# Patient Record
Sex: Male | Born: 1960 | Race: White | Hispanic: Refuse to answer | Marital: Married | Smoking: Never smoker
Health system: Southern US, Community
[De-identification: ages and names within clinical notes are randomized; demographics above are authoritative.]

## PROBLEM LIST (undated history)

## (undated) ENCOUNTER — Emergency Department: Payer: BLUE CROSS/BLUE SHIELD

## (undated) DIAGNOSIS — T7840XA Allergy, unspecified, initial encounter: Secondary | ICD-10-CM

## (undated) DIAGNOSIS — I10 Essential (primary) hypertension: Secondary | ICD-10-CM

## (undated) DIAGNOSIS — A419 Sepsis, unspecified organism: Secondary | ICD-10-CM

## (undated) HISTORY — PX: APPENDECTOMY: SHX54

## (undated) HISTORY — DX: Sepsis, unspecified organism: A41.9

## (undated) HISTORY — DX: Essential (primary) hypertension: I10

## (undated) HISTORY — DX: Allergy, unspecified, initial encounter: T78.40XA

---

## 1997-11-18 ENCOUNTER — Encounter: Payer: Self-pay | Admitting: Emergency Medicine

## 1997-11-18 ENCOUNTER — Emergency Department (HOSPITAL_COMMUNITY): Admission: EM | Admit: 1997-11-18 | Discharge: 1997-11-18 | Payer: Self-pay | Admitting: Emergency Medicine

## 2000-02-12 ENCOUNTER — Encounter: Payer: Self-pay | Admitting: *Deleted

## 2000-02-12 ENCOUNTER — Inpatient Hospital Stay (HOSPITAL_COMMUNITY): Admission: EM | Admit: 2000-02-12 | Discharge: 2000-02-14 | Payer: Self-pay | Admitting: *Deleted

## 2000-02-14 ENCOUNTER — Encounter: Payer: Self-pay | Admitting: Cardiology

## 2004-10-29 ENCOUNTER — Other Ambulatory Visit: Payer: Self-pay

## 2004-10-29 ENCOUNTER — Emergency Department: Payer: Self-pay | Admitting: Emergency Medicine

## 2008-02-23 LAB — HM COLONOSCOPY

## 2014-02-03 ENCOUNTER — Emergency Department (HOSPITAL_COMMUNITY): Payer: BC Managed Care – PPO

## 2014-02-03 ENCOUNTER — Emergency Department (HOSPITAL_COMMUNITY)
Admission: EM | Admit: 2014-02-03 | Discharge: 2014-02-03 | Disposition: A | Discharge status: Home / Self Care | Payer: BC Managed Care – PPO | Attending: Emergency Medicine | Admitting: Emergency Medicine

## 2014-02-03 ENCOUNTER — Encounter (HOSPITAL_COMMUNITY): Payer: Self-pay | Admitting: Emergency Medicine

## 2014-02-03 DIAGNOSIS — R260 Ataxic gait: Secondary | ICD-10-CM | POA: Diagnosis not present

## 2014-02-03 DIAGNOSIS — R42 Dizziness and giddiness: Secondary | ICD-10-CM | POA: Diagnosis present

## 2014-02-03 LAB — COMPREHENSIVE METABOLIC PANEL
ALT: 17 U/L (ref 0–53)
AST: 25 U/L (ref 0–37)
Albumin: 3.9 g/dL (ref 3.5–5.2)
Alkaline Phosphatase: 55 U/L (ref 39–117)
Anion gap: 11 (ref 5–15)
BUN: 17 mg/dL (ref 6–23)
CO2: 28 mEq/L (ref 19–32)
Calcium: 9.5 mg/dL (ref 8.4–10.5)
Chloride: 102 mEq/L (ref 96–112)
Creatinine, Ser: 1.1 mg/dL (ref 0.50–1.35)
GFR calc Af Amer: 86 mL/min — ABNORMAL LOW (ref >90)
GFR calc non Af Amer: 74 mL/min — ABNORMAL LOW (ref >90)
Glucose, Bld: 95 mg/dL (ref 70–99)
Potassium: 4.3 mEq/L (ref 3.7–5.3)
Sodium: 141 mEq/L (ref 137–147)
Total Bilirubin: <0.2 mg/dL — ABNORMAL LOW (ref 0.3–1.2)
Total Protein: 6.8 g/dL (ref 6.0–8.3)

## 2014-02-03 LAB — CBC WITH DIFFERENTIAL/PLATELET
Basophils Absolute: 0.1 10*3/uL (ref 0.0–0.1)
Basophils Relative: 1 % (ref 0–1)
Eosinophils Absolute: 0.4 10*3/uL (ref 0.0–0.7)
Eosinophils Relative: 7 % — ABNORMAL HIGH (ref 0–5)
HCT: 38.9 % — ABNORMAL LOW (ref 39.0–52.0)
Hemoglobin: 12.9 g/dL — ABNORMAL LOW (ref 13.0–17.0)
Lymphocytes Relative: 34 % (ref 12–46)
Lymphs Abs: 2.2 10*3/uL (ref 0.7–4.0)
MCH: 28.8 pg (ref 26.0–34.0)
MCHC: 33.2 g/dL (ref 30.0–36.0)
MCV: 86.8 fL (ref 78.0–100.0)
Monocytes Absolute: 0.6 10*3/uL (ref 0.1–1.0)
Monocytes Relative: 9 % (ref 3–12)
Neutro Abs: 3.2 10*3/uL (ref 1.7–7.7)
Neutrophils Relative %: 49 % (ref 43–77)
Platelets: 341 10*3/uL (ref 150–400)
RBC: 4.48 MIL/uL (ref 4.22–5.81)
RDW: 13 % (ref 11.5–15.5)
WBC: 6.5 10*3/uL (ref 4.0–10.5)

## 2014-02-03 LAB — I-STAT TROPONIN, ED: Troponin i, poc: 0 ng/mL (ref 0.00–0.08)

## 2014-02-03 MED ORDER — DIAZEPAM 5 MG/ML IJ SOLN
5.0000 mg | Freq: Once | INTRAMUSCULAR | Status: DC
  Filled 2014-02-03: qty 2

## 2014-02-03 MED ORDER — MECLIZINE HCL 25 MG PO TABS
50.0000 mg | ORAL_TABLET | Freq: Once | ORAL | Status: AC
  Administered 2014-02-03: 50 mg via ORAL
  Filled 2014-02-03: qty 2

## 2014-02-03 MED ORDER — GADOBENATE DIMEGLUMINE 529 MG/ML IV SOLN
20.0000 mL | Freq: Once | INTRAVENOUS | Status: AC | PRN
  Administered 2014-02-03: 20 mL via INTRAVENOUS

## 2014-02-03 MED ORDER — DIAZEPAM 5 MG/ML IJ SOLN
5.0000 mg | Freq: Once | INTRAMUSCULAR | Status: AC
  Administered 2014-02-03: 5 mg via INTRAVENOUS

## 2014-02-03 MED ORDER — DIAZEPAM 2 MG PO TABS: 2.0000 mg | ORAL_TABLET | Freq: Two times a day (BID) | ORAL | Status: DC

## 2014-02-03 NOTE — ED Notes (Signed)
Pt. reports vertigo " room spinning " onset this morning , denies nausea or vomitting , alert and oriented / respirations unlabored .

## 2014-02-03 NOTE — ED Notes (Signed)
Took over care of this patient at this time.

## 2014-02-03 NOTE — ED Notes (Addendum)
   Pt ambulated to the restroom, c/o slight dizziness

## 2014-02-03 NOTE — ED Notes (Signed)
PT reports during the episode he  feeling dizzy his family reported slurred speech.Pt also reports that His father and Grandfather had CVA.

## 2014-02-03 NOTE — ED Notes (Signed)
PA spoke with patient. Patient experiencing dizziness due to medication.

## 2014-02-03 NOTE — ED Notes (Signed)
PA at the bedside.

## 2014-02-03 NOTE — ED Provider Notes (Signed)
CSN: [id number]     Arrival date & time 02/03/14  0455 History   First MD Initiated Contact with Patient 02/03/14 [phone]     Chief Complaint  Patient presents with  . Dizziness     (Consider location/radiation/quality/duration/timing/severity/associated sxs/prior Treatment) HPI Willie Good is a 53 y.o. male with no medical problems, presents to emergency department complaining of dizziness. Patient states he woke up this morning around 4 AM feeling fine, but when went to get up, states felt like "my head shot I had opened me and everything was spinning around." He states he lowered himself to the ground because he felt like he was going to fall. He states every time he tried to get up and walk he had similar sensation of everything spinning. He denies any head injury. No headache, no nausea or vomiting. I similar symptoms in the past. He also felt like he may have had some slurred speech during these episodes. He denies any difficulty speaking, no difficulty with movement of his extremities, no numbness or weakness. He reports history of CVAs in his father and grandfather. He states he does not take any medications daily, only takes vitamins.  History reviewed. No pertinent past medical history. History reviewed. No pertinent past surgical history. No family history on file. History  Substance Use Topics  . Smoking status: Never Smoker   . Smokeless tobacco: Not on file  . Alcohol Use: Yes    Review of Systems  Constitutional: Negative for fever and chills.  Respiratory: Negative for cough, chest tightness and shortness of breath.   Cardiovascular: Negative for chest pain, palpitations and leg swelling.  Gastrointestinal: Negative for nausea, vomiting, abdominal pain, diarrhea and abdominal distention.  Genitourinary: Negative for dysuria, urgency, frequency and hematuria.  Musculoskeletal: Positive for gait problem. Negative for myalgias, arthralgias, neck pain and neck stiffness.   Skin: Negative for rash.  Allergic/Immunologic: Negative for immunocompromised state.  Neurological: Positive for dizziness. Negative for speech difficulty, weakness, light-headedness, numbness and headaches.       Difficulty walking  All other systems reviewed and are negative.     Allergies  Review of patient's allergies indicates no known allergies.  Home Medications   Prior to Admission medications   Not on File   BP 170/94 mmHg  Pulse 66  Temp(Src) 97.6 F (36.4 C) (Oral)  Resp 18  SpO2 100% Physical Exam  Constitutional: He is oriented to person, place, and time. He appears well-developed and well-nourished. No distress.  HENT:  Head: Normocephalic and atraumatic.  Eyes: Conjunctivae and EOM are normal. Pupils are equal, round, and reactive to light.  Neck: Neck supple.  Cardiovascular: Normal rate, regular rhythm and normal heart sounds.   Pulmonary/Chest: Effort normal. No respiratory distress. He has no wheezes. He has no rales.  Abdominal: Soft. Bowel sounds are normal. He exhibits no distension. There is no tenderness. There is no rebound.  Musculoskeletal: He exhibits no edema.  Neurological: He is alert and oriented to person, place, and time.  5/5 and equal upper and lower extremity strength bilaterally. Equal grip strength bilaterally. Normal finger to nose and heel to shin. No pronator drift. Patellar reflexes 2+. Did not ambulate, pt feels unbalanced with surrouding spinning upon standing   Skin: Skin is warm and dry.  Nursing note and vitals reviewed.   ED Course  Procedures (including critical care time) Labs Review Labs Reviewed  CBC WITH DIFFERENTIAL - Abnormal; Notable for the following:    Hemoglobin 12.9 (*)  HCT 38.9 (*)    Eosinophils Relative 7 (*)    All other components within normal limits  COMPREHENSIVE METABOLIC PANEL - Abnormal; Notable for the following:    Total Bilirubin <0.2 (*)    GFR calc non Af Amer 74 (*)    GFR calc Af  Amer 86 (*)    All other components within normal limits  I-STAT TROPOININ, ED    Imaging Review Ct Head (brain) Wo Contrast  02/03/2014   CLINICAL DATA:  53 year old male with dizziness/ vertigo upon waking up this morning  EXAM: CT HEAD WITHOUT CONTRAST  TECHNIQUE: Contiguous axial images were obtained from the base of the skull through the vertex without intravenous contrast.  COMPARISON:  None.  FINDINGS: Two small foci of hypoattenuation in the subcortical white matter of the right posterior temporal lobe (image 14 series 2) no surrounding edema or mass effect. Hypoattenuation does not extend to the cerebral cortex. Otherwise, there is no evidence of acute intracranial hemorrhage, stroke or mass. No acute soft tissue or calvarial abnormality. The globes and orbits are symmetric and unremarkable. Normal aeration of the mastoid air cells and visualized paranasal sinuses.  IMPRESSION: Two small foci of hypoattenuation in the subcortical white matter of the posterior right temporal lobe are nonspecific. This may represent the sequelae of chronic microvascular ischemic white matter disease. Given the focality, small foci of ischemia or the sequelae of prior demyelination, for infection/inflammation are considerations. If further imaging is clinically warranted, consider MRI with and without contrast.  Otherwise, negative head CT.   Electronically Signed   By: Jacqulynn Cadet M.D.   On: 02/03/2014 07:37   Mr Jeri Cos JJ Contrast  02/03/2014   CLINICAL DATA:  Acute onset of vertigo this morning.  EXAM: MRI HEAD WITHOUT AND WITH CONTRAST  TECHNIQUE: Multiplanar, multiecho pulse sequences of the brain and surrounding structures were obtained without and with intravenous contrast.  CONTRAST:  65mL MULTIHANCE GADOBENATE DIMEGLUMINE 529 MG/ML IV SOLN  COMPARISON:  Head CT same day  FINDINGS: Diffusion imaging does not show any acute or subacute infarction. The brainstem and cerebellum are normal. The cerebral  hemispheres are normal except for a few scattered foci of T2 and FLAIR signal within the deep and subcortical white matter likely to represent an early manifestation of small vessel disease. No cortical or large vessel territory infarction. No mass lesion, hemorrhage, hydrocephalus or extra-axial collection. No fluid in the sinuses, middle ears or mastoids. Mastoid air cells are poorly developed particularly on the right. Major vessels at the base of the brain show flow.  IMPRESSION: No acute finding. No cause of the presenting symptoms is identified. Minimal small vessel change of the white matter, not unusual at this age.   Electronically Signed   By: Nelson Chimes M.D.   On: 02/03/2014 11:06     EKG Interpretation   Date/Time:  Sunday February 03 2014 09:29:40 EST Ventricular Rate:  60 PR Interval:  151 QRS Duration: 113 QT Interval:  435 QTC Calculation: 435 R Axis:   56 Text Interpretation:  Sinus rhythm Borderline intraventricular conduction  delay Nonspecific T abnormalities, inferior leads No acute changes  Confirmed by Kathrynn Humble, MD, ANKIT ([id number]) on 02/03/2014 11:58:49 AM      MDM   Final diagnoses:  Vertigo    Pt with vertigo like symptoms. No hx of the same. No head injury. Family hx of CVA. Will get labs, CT head.    CT showing two small foci of hypoattenuation, recommended  MR with/wo contrast. MR ordered. Labs unremarkable.   MR negative. Pt had mild improvement in symptoms after meclizine 50mg . Will try valium 5mg  IM   Valium was changed from IM to IV by RN. PT very sleepy but states dizziness resolved. He was able to ambulate up and down the hallway with no vertigo symptoms. Doubt TIA at this time and CVA ruled out with MRI. Will d/c home with PO valium and close outpatient follow up.   Filed Vitals:   02/03/14 1300 02/03/14 1315 02/03/14 1330 02/03/14 1345  BP: 148/93 154/94 148/97 155/98  Pulse: 58 53 56 53  Temp:      TempSrc:      Resp: 15 16 15 14   SpO2:  97% 98% 98% 99%     Renold Genta, PA-C 02/03/14 [place], MD 02/03/14 1718

## 2014-02-03 NOTE — Discharge Instructions (Signed)
Take valium as prescribed as needed for dizziness. Follow up closely with primary care doctor if not improving. Return if worsening symptoms.    Benign Positional Vertigo Vertigo means you feel like you or your surroundings are moving when they are not. Benign positional vertigo is the most common form of vertigo. Benign means that the cause of your condition is not serious. Benign positional vertigo is more common in older adults. CAUSES  Benign positional vertigo is the result of an upset in the labyrinth system. This is an area in the middle ear that helps control your balance. This may be caused by a viral infection, head injury, or repetitive motion. However, often no specific cause is found. SYMPTOMS  Symptoms of benign positional vertigo occur when you move your head or eyes in different directions. Some of the symptoms may include:  Loss of balance and falls.  Vomiting.  Blurred vision.  Dizziness.  Nausea.  Involuntary eye movements (nystagmus). DIAGNOSIS  Benign positional vertigo is usually diagnosed by physical exam. If the specific cause of your benign positional vertigo is unknown, your caregiver may perform imaging tests, such as magnetic resonance imaging (MRI) or computed tomography (CT). TREATMENT  Your caregiver may recommend movements or procedures to correct the benign positional vertigo. Medicines such as meclizine, benzodiazepines, and medicines for nausea may be used to treat your symptoms. In rare cases, if your symptoms are caused by certain conditions that affect the inner ear, you may need surgery. HOME CARE INSTRUCTIONS   Follow your caregiver's instructions.  Move slowly. Do not make sudden body or head movements.  Avoid driving.  Avoid operating heavy machinery.  Avoid performing any tasks that would be dangerous to you or others during a vertigo episode.  Drink enough fluids to keep your urine clear or pale yellow. SEEK IMMEDIATE MEDICAL CARE IF:     You develop problems with walking, weakness, numbness, or using your arms, hands, or legs.  You have difficulty speaking.  You develop severe headaches.  Your nausea or vomiting continues or gets worse.  You develop visual changes.  Your family or friends notice any behavioral changes.  Your condition gets worse.  You have a fever.  You develop a stiff neck or sensitivity to light. MAKE SURE YOU:   Understand these instructions.  Will watch your condition.  Will get help right away if you are not doing well or get worse. Document Released: 11/16/2005 Document Revised: 05/03/2011 Document Reviewed: 10/29/2010 [facility] Patient Information 2015 [place], [place]. This information is not intended to replace advice given to you by your health care provider. Make sure you discuss any questions you have with your health care provider.

## 2014-02-03 NOTE — ED Notes (Signed)
Pt ambulates without distress respirations easy non labored

## 2014-05-10 DIAGNOSIS — E785 Hyperlipidemia, unspecified: Secondary | ICD-10-CM | POA: Insufficient documentation

## 2015-01-14 ENCOUNTER — Ambulatory Visit (INDEPENDENT_AMBULATORY_CARE_PROVIDER_SITE_OTHER): Payer: BLUE CROSS/BLUE SHIELD | Admitting: Family Medicine

## 2015-01-14 VITALS — BP 150/80 | HR 63 | Temp 98.1°F | Resp 16 | Ht 73.0 in | Wt 221.2 lb

## 2015-01-14 DIAGNOSIS — H9311 Tinnitus, right ear: Secondary | ICD-10-CM

## 2015-01-14 DIAGNOSIS — H65192 Other acute nonsuppurative otitis media, left ear: Secondary | ICD-10-CM | POA: Diagnosis not present

## 2015-01-14 DIAGNOSIS — J0100 Acute maxillary sinusitis, unspecified: Secondary | ICD-10-CM

## 2015-01-14 MED ORDER — AMOXICILLIN-POT CLAVULANATE 875-125 MG PO TABS: 1.0000 | ORAL_TABLET | Freq: Two times a day (BID) | ORAL | Status: DC

## 2015-01-14 NOTE — Patient Instructions (Signed)
  Use Afrin for several days in left nostril   Otitis Media With Effusion Otitis media with effusion is the presence of fluid in the middle ear. This is a common problem in children, which often follows ear infections. It may be present for weeks or longer after the infection. Unlike an acute ear infection, otitis media with effusion refers only to fluid behind the ear drum and not infection. Children with repeated ear and sinus infections and allergy problems are the most likely to get otitis media with effusion. CAUSES  The most frequent cause of the fluid buildup is dysfunction of the eustachian tubes. These are the tubes that drain fluid in the ears to the back of the nose (nasopharynx). SYMPTOMS   The main symptom of this condition is hearing loss. As a result, you or your child may:  Listen to the TV at a loud volume.  Not respond to questions.  Ask "what" often when spoken to.  Mistake or confuse one sound or word for another.  There may be a sensation of fullness or pressure but usually not pain. DIAGNOSIS   Your health care provider will diagnose this condition by examining you or your child's ears.  Your health care provider may test the pressure in you or your child's ear with a tympanometer.  A hearing test may be conducted if the problem persists. TREATMENT   Treatment depends on the duration and the effects of the effusion.  Antibiotics, decongestants, nose drops, and cortisone-type drugs (tablets or nasal spray) may not be helpful.  Children with persistent ear effusions may have delayed language or behavioral problems. Children at risk for developmental delays in hearing, learning, and speech may require referral to a specialist earlier than children not at risk.  You or your child's health care provider may suggest a referral to an ear, nose, and throat surgeon for treatment. The following may help restore normal hearing:  Drainage of fluid.  Placement of ear  tubes (tympanostomy tubes).  Removal of adenoids (adenoidectomy). HOME CARE INSTRUCTIONS   Avoid secondhand smoke.  Infants who are breastfed are less likely to have this condition.  Avoid feeding infants while they are lying flat.  Avoid known environmental allergens.  Avoid people who are sick. SEEK MEDICAL CARE IF:   Hearing is not better in 3 months.  Hearing is worse.  Ear pain.  Drainage from the ear.  Dizziness. MAKE SURE YOU:   Understand these instructions.  Will watch your condition.  Will get help right away if you are not doing well or get worse.   This information is not intended to replace advice given to you by your health care provider. Make sure you discuss any questions you have with your health care provider.   Document Released: 03/18/2004 Document Revised: 03/01/2014 Document Reviewed: 09/05/2012 Elsevier Interactive Patient Education [employer].

## 2015-01-14 NOTE — Progress Notes (Signed)
This chart was scribed for Willie Haber, MD by Royann Shivers Rifaie medical scribe at [facility].The patient was seen in exam room14 and the patient's care was started at 8:48 AM.  Patient ID: Willie Good MRN: [id number], DOB: 16-Aug-1960, 54 y.o. Date of Encounter: 01/14/2015  Primary Physician: No primary care provider on file.  Chief Complaint:  Chief Complaint  Patient presents with  . Sinusitis  . Sore Throat    scratchy throat    HPI:  Willie Good is a 54 y.o. male who presents to [facility] complaining of possible sinusitis.  Pt reports associated symptoms of sore throat, congestion, and tinnitus of the left ear with a secondary trouble hearing first onset yesterday. Pt denies otalgia.   Pt works as a Geophysicist/field seismologist for [employer].    No past medical history on file.   Home Meds: Prior to Admission medications   Medication Sig Start Date End Date Taking? Authorizing Provider  Multiple Vitamins-Minerals (MULTIVITAMIN WITH MINERALS) tablet Take 1 tablet by mouth daily.   Yes Historical Provider, MD  diazepam (VALIUM) 2 MG tablet Take 1-2 tablets (2-4 mg total) by mouth 2 (two) times daily. Patient not taking: Reported on 01/14/2015 02/03/14   Tatyana Kirichenko, PA-C  finasteride (PROPECIA) 1 MG tablet Take 1 mg by mouth daily. 01/31/14   Historical Provider, MD  naproxen sodium (ANAPROX) 220 MG tablet Take 660 mg by mouth once.    Historical Provider, MD    Allergies: No Known Allergies  Social History   Social History  . Marital Status: Married    Spouse Name: N/A  . Number of Children: N/A  . Years of Education: N/A   Occupational History  . Not on file.   Social History Main Topics  . Smoking status: Never Smoker   . Smokeless tobacco: Not on file  . Alcohol Use: Yes  . Drug Use: No  . Sexual Activity: Not on file   Other Topics Concern  . Not on file   Social History Narrative     Review of Systems: Constitutional:  negative for chills, fever, night sweats, weight changes, or fatigue  HEENT: negative for vision changes,  rhinorrhea, ST, epistaxis, or sinus pressure. Positive for sore throat, hearing loss, congestion, tinnitus.  Cardiovascular: negative for chest pain or palpitations Respiratory: negative for hemoptysis, wheezing, shortness of breath, or cough Abdominal: negative for abdominal pain, nausea, vomiting, diarrhea, or constipation Dermatological: negative for rash Neurologic: negative for headache, dizziness, or syncope All other systems reviewed and are otherwise negative with the exception to those above and in the HPI.  Physical Exam: Blood pressure 150/80, pulse 63, temperature 98.1 F (36.7 C), temperature source Oral, resp. rate 16, height 6\' 1"  (1.854 m), weight 221 lb 3.2 oz (100.336 kg), SpO2 98 %., Body mass index is 29.19 kg/(m^2). General: Well developed, well nourished, in no acute distress. Head: Normocephalic, atraumatic, eyes without discharge, sclera non-icteric, nares are without discharge. Bilateral auditory canals clear, right TM is without perforation, pearly grey and translucent with reflective cone of light bilaterally. Left TM is amber and red and retracted. Oral cavity moist, posterior pharynx without exudate, erythema, peritonsillar abscess, or post nasal drip. Nasal passages full and swollen Neck: Supple. No thyromegaly. Full ROM. No lymphadenopathy.  Msk:  Strength and tone normal for age. Extremities/Skin: Warm and dry. No clubbing or cyanosis. No edema. No rashes or suspicious lesions. Neuro: Alert and oriented X 3. Moves all extremities  spontaneously. Gait is normal. CNII-XII grossly in tact. Psych:  Responds to questions appropriately with a normal affect.    ASSESSMENT AND PLAN:  54 y.o. year old male with   By signing my name below, I, Rawaa Al Rifaie, attest that this documentation has been prepared under the direction and in the presence of Willie Good,  [place], Medical Scribe. 01/14/2015.  8:55 AM.  This chart was scribed in my presence and reviewed by me personally.    ICD-9-CM ICD-10-CM   1. Acute nonsuppurative otitis media of left ear 381.00 H65.192 amoxicillin-clavulanate (AUGMENTIN) 875-125 MG tablet  2. Tinnitus aurium, right 388.31 H93.11 amoxicillin-clavulanate (AUGMENTIN) 875-125 MG tablet  3. Acute maxillary sinusitis, recurrence not specified 461.0 J01.00 amoxicillin-clavulanate (AUGMENTIN) 875-125 MG tablet     Signed, Willie Haber, MD  Signed, Willie Haber, MD 01/14/2015 8:48 AM

## 2015-04-05 ENCOUNTER — Ambulatory Visit (INDEPENDENT_AMBULATORY_CARE_PROVIDER_SITE_OTHER): Payer: BLUE CROSS/BLUE SHIELD | Admitting: Physician Assistant

## 2015-04-05 VITALS — BP 120/78 | HR 68 | Temp 98.3°F | Resp 20 | Ht 73.0 in | Wt 212.2 lb

## 2015-04-05 DIAGNOSIS — I10 Essential (primary) hypertension: Secondary | ICD-10-CM | POA: Diagnosis not present

## 2015-04-05 DIAGNOSIS — Z8249 Family history of ischemic heart disease and other diseases of the circulatory system: Secondary | ICD-10-CM | POA: Diagnosis not present

## 2015-04-05 DIAGNOSIS — Z1329 Encounter for screening for other suspected endocrine disorder: Secondary | ICD-10-CM | POA: Diagnosis not present

## 2015-04-05 DIAGNOSIS — Z139 Encounter for screening, unspecified: Secondary | ICD-10-CM | POA: Diagnosis not present

## 2015-04-05 DIAGNOSIS — N411 Chronic prostatitis: Secondary | ICD-10-CM

## 2015-04-05 DIAGNOSIS — Z130 Encounter for screening for diseases of the blood and blood-forming organs and certain disorders involving the immune mechanism: Secondary | ICD-10-CM

## 2015-04-05 DIAGNOSIS — Z131 Encounter for screening for diabetes mellitus: Secondary | ICD-10-CM | POA: Diagnosis not present

## 2015-04-05 DIAGNOSIS — Z125 Encounter for screening for malignant neoplasm of prostate: Secondary | ICD-10-CM | POA: Diagnosis not present

## 2015-04-05 LAB — CBC WITH DIFFERENTIAL/PLATELET
Basophils Absolute: 0.1 10*3/uL (ref 0.0–0.1)
Basophils Relative: 2 % — ABNORMAL HIGH (ref 0–1)
Eosinophils Absolute: 0.5 10*3/uL (ref 0.0–0.7)
Eosinophils Relative: 9 % — ABNORMAL HIGH (ref 0–5)
HEMATOCRIT: 42 % (ref 39.0–52.0)
Hemoglobin: 14.3 g/dL (ref 13.0–17.0)
LYMPHS ABS: 2.3 10*3/uL (ref 0.7–4.0)
LYMPHS PCT: 42 % (ref 12–46)
MCH: 29.5 pg (ref 26.0–34.0)
MCHC: 34 g/dL (ref 30.0–36.0)
MCV: 86.8 fL (ref 78.0–100.0)
MONO ABS: 0.3 10*3/uL (ref 0.1–1.0)
MPV: 8.4 fL — AB (ref 8.6–12.4)
Monocytes Relative: 6 % (ref 3–12)
Neutro Abs: 2.3 10*3/uL (ref 1.7–7.7)
Neutrophils Relative %: 41 % — ABNORMAL LOW (ref 43–77)
Platelets: 380 10*3/uL (ref 150–400)
RBC: 4.84 MIL/uL (ref 4.22–5.81)
RDW: 13.4 % (ref 11.5–15.5)
WBC: 5.5 10*3/uL (ref 4.0–10.5)

## 2015-04-05 LAB — COMPLETE METABOLIC PANEL WITH GFR
ALT: 11 U/L (ref 9–46)
AST: 21 U/L (ref 10–35)
Albumin: 4.6 g/dL (ref 3.6–5.1)
Alkaline Phosphatase: 52 U/L (ref 40–115)
BILIRUBIN TOTAL: 0.5 mg/dL (ref 0.2–1.2)
BUN: 10 mg/dL (ref 7–25)
CO2: 30 mmol/L (ref 20–31)
Calcium: 9.3 mg/dL (ref 8.6–10.3)
Chloride: 99 mmol/L (ref 98–110)
Creat: 1.31 mg/dL (ref 0.70–1.33)
GFR, Est African American: 70 mL/min (ref >=60)
GFR, Est Non African American: 61 mL/min (ref >=60)
GLUCOSE: 98 mg/dL (ref 65–99)
Potassium: 4.8 mmol/L (ref 3.5–5.3)
Sodium: 137 mmol/L (ref 135–146)
TOTAL PROTEIN: 7.1 g/dL (ref 6.1–8.1)

## 2015-04-05 LAB — TSH: TSH: 4.11 mIU/L (ref 0.40–4.50)

## 2015-04-05 LAB — HOMOCYSTEINE: HOMOCYSTEINE: 11.2 umol/L (ref <11.4)

## 2015-04-05 LAB — C-REACTIVE PROTEIN

## 2015-04-05 LAB — HEMOGLOBIN A1C
Hgb A1c MFr Bld: 5.4 % (ref <5.7)
Mean Plasma Glucose: 108 mg/dL (ref <117)

## 2015-04-05 NOTE — Progress Notes (Signed)
   Willie Good  MRN: [id number] DOB: 1960/12/05  Subjective:  Pt presents to clinic for blood work - he has an appt in a week with his cardiologist and needs blood work done prior to the visit. His regular office faxed [place] a copy of the orders but we never received them and so he has a note with the labs they requested written down.  [facility]  [place], [place] [address]  [facility]  [place], [place] [address]   Patient Active Problem List   Diagnosis Date Noted  . Benign essential HTN 04/05/2015  . Combined fat and carbohydrate induced hyperlipemia 05/10/2014    Current Outpatient Prescriptions on File Prior to Visit  Medication Sig Dispense Refill  . Multiple Vitamins-Minerals (MULTIVITAMIN WITH MINERALS) tablet Take 1 tablet by mouth daily.     No current facility-administered medications on file prior to visit.    No Known Allergies  Review of Systems Objective:  BP 120/78 mmHg  Pulse 68  Temp(Src) 98.3 F (36.8 C) (Oral)  Resp 20  Ht 6\' 1"  (1.854 m)  Wt 212 lb 3.2 oz (96.253 kg)  BMI 28.00 kg/m2  SpO2 96%  Physical Exam  Constitutional: He is oriented to person, place, and time and well-developed, well-nourished, and in no distress.  HENT:  Head: Normocephalic and atraumatic.  Right Ear: External ear normal.  Left Ear: External ear normal.  Eyes: Conjunctivae are normal.  Neck: Normal range of motion.  Pulmonary/Chest: Effort normal.  Neurological: He is alert and oriented to person, place, and time. Gait normal.  Skin: Skin is warm and dry.  Psychiatric: Mood, memory, affect and judgment normal.    Assessment and Plan :  Benign essential HTN - Plan: COMPLETE METABOLIC PANEL WITH GFR, Care order/instruction  Screening for deficiency anemia - Plan: CBC with Differential/Platelet  Family history of cardiac disorder - Plan: Fibrinogen, Homocysteine, C-reactive  protein  Screening PSA (prostate specific antigen)  Screening for thyroid disorder - Plan: TSH  Chronic prostatitis - Plan: PSA  Screening for diabetes mellitus - Plan: Hemoglobin A1c  Screening - Plan: DHEA, Testosterone Total,Free,Bio, Males, Estradiol   Check labs and will fax the results to his PCP and caardiologist when I get them.  Windell Hummingbird PA-C  [facility] 04/05/2015 8:53 AM

## 2015-04-07 LAB — TESTOSTERONE TOTAL,FREE,BIO, MALES
ALBUMIN: 4.6 g/dL (ref 3.6–5.1)
Sex Hormone Binding: 96 nmol/L — ABNORMAL HIGH (ref 10–50)
TESTOSTERONE: 557 ng/dL (ref 250–827)
Testosterone, Bioavailable: 59.8 ng/dL — ABNORMAL LOW (ref 130.5–681.7)
Testosterone, Free: 28.5 pg/mL — ABNORMAL LOW (ref 47.0–244.0)

## 2015-04-07 LAB — PSA: PSA: 0.52 ng/mL (ref <=4.00)

## 2015-04-09 LAB — FIBRINOGEN: Fibrinogen: 291 mg/dL (ref 204–475)

## 2015-04-10 ENCOUNTER — Telehealth: Payer: Self-pay | Admitting: *Deleted

## 2015-04-10 NOTE — Telephone Encounter (Signed)
Faxed lab results to Bartholome Bill MD at [facility] in [place], per Windell Hummingbird PA-C.

## 2015-04-11 LAB — ESTRADIOL, FREE
Estradiol, Free: 0.31 pg/mL (ref <=0.45)
Estradiol: 21 pg/mL (ref <=29)

## 2015-04-12 ENCOUNTER — Telehealth: Payer: Self-pay | Admitting: *Deleted

## 2015-04-12 NOTE — Telephone Encounter (Signed)
Pt left message on lab machine and called him back he wanted to know about cholesterol panel.  Advised him that we did not do one.  He will probably be in tom because he is sick and we can recheck it then.

## 2015-04-13 ENCOUNTER — Ambulatory Visit (INDEPENDENT_AMBULATORY_CARE_PROVIDER_SITE_OTHER): Payer: BLUE CROSS/BLUE SHIELD | Admitting: Family Medicine

## 2015-04-13 VITALS — BP 140/78 | HR 89 | Temp 98.2°F | Resp 16 | Ht 72.0 in | Wt 219.0 lb

## 2015-04-13 DIAGNOSIS — Z13220 Encounter for screening for lipoid disorders: Secondary | ICD-10-CM | POA: Diagnosis not present

## 2015-04-13 DIAGNOSIS — J111 Influenza due to unidentified influenza virus with other respiratory manifestations: Secondary | ICD-10-CM

## 2015-04-13 DIAGNOSIS — R05 Cough: Secondary | ICD-10-CM

## 2015-04-13 DIAGNOSIS — R6889 Other general symptoms and signs: Secondary | ICD-10-CM

## 2015-04-13 LAB — POCT CBC
GRANULOCYTE PERCENT: 59.5 % (ref 37–80)
HEMATOCRIT: 35.1 % — AB (ref 43.5–53.7)
Hemoglobin: 12.3 g/dL — AB (ref 14.1–18.1)
LYMPH, POC: 1.7 (ref 0.6–3.4)
MCH, POC: 30.5 pg (ref 27–31.2)
MCHC: 35.1 g/dL (ref 31.8–35.4)
MCV: 87 fL (ref 80–97)
MID (cbc): 1.1 — AB (ref 0–0.9)
MPV: 5.7 fL (ref 0–99.8)
POC GRANULOCYTE: 4 (ref 2–6.9)
POC LYMPH %: 24.7 % (ref 10–50)
POC MID %: 15.8 %M — AB (ref 0–12)
Platelet Count, POC: 268 10*3/uL (ref 142–424)
RBC: 4.03 M/uL — AB (ref 4.69–6.13)
RDW, POC: 14.2 %
WBC: 6.8 10*3/uL (ref 4.6–10.2)

## 2015-04-13 LAB — POCT RAPID STREP A (OFFICE): Rapid Strep A Screen: NEGATIVE

## 2015-04-13 LAB — POCT INFLUENZA A/B
Influenza A, POC: NEGATIVE
Influenza B, POC: NEGATIVE

## 2015-04-13 MED ORDER — OSELTAMIVIR PHOSPHATE 75 MG PO CAPS: 75.0000 mg | ORAL_CAPSULE | Freq: Two times a day (BID) | ORAL | Status: DC

## 2015-04-13 NOTE — Patient Instructions (Signed)

## 2015-04-13 NOTE — Progress Notes (Signed)
Subjective:    Patient ID: Willie Good, male    DOB: 05-Mar-1960, 55 y.o.   MRN: [id number]  04/13/2015  Sore Throat   HPI This 55 y.o. male presents for evaluation of sore throat.  Had a cold started three days ago.  Then took Friday off for doctor's appointments. Then sore throat developed.  Severe sore throat developed later that day.  Continued sore throat.  Taking vapor rub. Sweated a lot last night.  Now can breathe through nose again; +nasal congestion.  Throat is very raw.  Drinking coffee with improvement.  Must work tomorrow so needs to undergo evaluation.  No fever; +sweats last night.  No chills.  +body aches.  Took ibuprofen with improvement. Last dose of Ibuprofen last night.  No headache.  No ear pain.  ST diffuse; mild pain with swallowing.  +nasal congestion with improvement; +rhinorrhea clear and then excessive amount; specks of yellow; +coughing mild.  No SOB or wheezing.  No tobacco. No n/v/d.  No rash.  +sputum.  Oil of oregano in nostrils.  Afrin.   No flu vaccine.  Needs cholesterol level checked.   PCP: Dr. Rex Kras.   Review of Systems  Constitutional: Positive for diaphoresis. Negative for fever, chills, activity change, appetite change and fatigue.  HENT: Positive for congestion, postnasal drip, rhinorrhea and sore throat. Negative for ear pain, trouble swallowing and voice change.   Respiratory: Positive for cough. Negative for shortness of breath and wheezing.   Cardiovascular: Negative for chest pain, palpitations and leg swelling.  Gastrointestinal: Negative for nausea, vomiting, abdominal pain and diarrhea.  Endocrine: Negative for cold intolerance, heat intolerance, polydipsia, polyphagia and polyuria.  Skin: Negative for color change, rash and wound.  Neurological: Positive for headaches. Negative for dizziness, tremors, seizures, syncope, facial asymmetry, speech difficulty, weakness, light-headedness and numbness.  Psychiatric/Behavioral: Negative for  sleep disturbance and dysphoric mood. The patient is not nervous/anxious.     Past Medical History  Diagnosis Date  . Allergy    Past Surgical History  Procedure Laterality Date  . Appendectomy     No Known Allergies  Social History   Social History  . Marital Status: Married    Spouse Name: N/A  . Number of Children: N/A  . Years of Education: N/A   Occupational History  . Not on file.   Social History Main Topics  . Smoking status: Never Smoker   . Smokeless tobacco: Not on file  . Alcohol Use: Yes  . Drug Use: No  . Sexual Activity: Not on file   Other Topics Concern  . Not on file   Social History Narrative   Family History  Problem Relation Age of Onset  . Cancer Mother   . Diabetes Father   . Heart disease Father   . Hyperlipidemia Father   . Hypertension Father   . Diabetes Sister   . Cancer Maternal Grandmother   . Cancer Maternal Grandfather   . Cancer Paternal Grandmother   . Diabetes Paternal Grandfather   . Heart disease Paternal Grandfather   . Hyperlipidemia Paternal Grandfather   . Hypertension Paternal Grandfather   . Stroke Paternal Grandfather        Objective:    BP 140/78 mmHg  Pulse 89  Temp(Src) 98.2 F (36.8 C)  Resp 16  Ht 6' (1.829 m)  Wt 219 lb (99.338 kg)  BMI 29.70 kg/m2  SpO2 98% Physical Exam  Constitutional: He is oriented to person, place, and time. He appears  well-developed and well-nourished. No distress.  HENT:  Head: Normocephalic and atraumatic.  Right Ear: Tympanic membrane and ear canal normal.  Left Ear: Tympanic membrane and ear canal normal.  Nose: Mucosal edema and rhinorrhea present. Right sinus exhibits no maxillary sinus tenderness and no frontal sinus tenderness. Left sinus exhibits no maxillary sinus tenderness and no frontal sinus tenderness.  Mouth/Throat: Uvula is midline and mucous membranes are normal.  Eyes: Conjunctivae and EOM are normal. Pupils are equal, round, and reactive to light.    Neck: Normal range of motion. Neck supple. Carotid bruit is not present. No thyromegaly present.  Cardiovascular: Normal rate, regular rhythm, normal heart sounds and intact distal pulses.  Exam reveals no gallop and no friction rub.   No murmur heard. Pulmonary/Chest: Effort normal and breath sounds normal. He has no wheezes. He has no rales.  Lymphadenopathy:    He has no cervical adenopathy.  Neurological: He is alert and oriented to person, place, and time. No cranial nerve deficit.  Skin: Skin is warm and dry. No rash noted. He is not diaphoretic.  Psychiatric: He has a normal mood and affect. His behavior is normal.  Nursing note and vitals reviewed.  Results for orders placed or performed in visit on 04/13/15  Culture, Group A Strep  Result Value Ref Range   Organism ID, Bacteria Normal Upper Respiratory Flora    Organism ID, Bacteria No Beta Hemolytic Streptococci Isolated   Lipid panel  Result Value Ref Range   Cholesterol 151 125 - 200 mg/dL   Triglycerides 64 <150 mg/dL   HDL 58 >=40 mg/dL   Total CHOL/HDL Ratio 2.6 <=5.0 Ratio   VLDL 13 <30 mg/dL   LDL Cholesterol 80 <130 mg/dL  POCT CBC  Result Value Ref Range   WBC 6.8 4.6 - 10.2 K/uL   Lymph, poc 1.7 0.6 - 3.4   POC LYMPH PERCENT 24.7 10 - 50 %L   MID (cbc) 1.1 (A) 0 - 0.9   POC MID % 15.8 (A) 0 - 12 %M   POC Granulocyte 4.0 2 - 6.9   Granulocyte percent 59.5 37 - 80 %G   RBC 4.03 (A) 4.69 - 6.13 M/uL   Hemoglobin 12.3 (A) 14.1 - 18.1 g/dL   HCT, POC 35.1 (A) 43.5 - 53.7 %   MCV 87.0 80 - 97 fL   MCH, POC 30.5 27 - 31.2 pg   MCHC 35.1 31.8 - 35.4 g/dL   RDW, POC 14.2 %   Platelet Count, POC 268 142 - 424 K/uL   MPV 5.7 0 - 99.8 fL  POCT Influenza A/B  Result Value Ref Range   Influenza A, POC Negative Negative   Influenza B, POC Negative Negative  POCT rapid strep A  Result Value Ref Range   Rapid Strep A Screen Negative Negative       Assessment & Plan:   1. Influenza   2. Flu-like symptoms    3. Screening, lipid     Orders Placed This Encounter  Procedures  . Culture, Group A Strep    Order Specific Question:  Source    Answer:  THROAT  . Lipid panel    Order Specific Question:  Has the patient fasted?    Answer:  Yes  . POCT CBC  . POCT Influenza A/B  . POCT rapid strep A   Meds ordered this encounter  Medications  . oseltamivir (TAMIFLU) 75 MG capsule    Sig: Take 1 capsule (75 mg total)  by mouth 2 (two) times daily.    Dispense:  10 capsule    Refill:  0    No Follow-up on file.    Iceis Knab Elayne Guerin, M.D. [facility] [address] [place],   [address] [phone] phone [phone] fax

## 2015-04-14 LAB — LIPID PANEL
CHOL/HDL RATIO: 2.6 ratio (ref <=5.0)
CHOLESTEROL: 151 mg/dL (ref 125–200)
HDL: 58 mg/dL (ref >=40)
LDL CALC: 80 mg/dL (ref <130)
Triglycerides: 64 mg/dL (ref <150)
VLDL: 13 mg/dL (ref <30)

## 2015-04-15 LAB — CULTURE, GROUP A STREP: Organism ID, Bacteria: NORMAL

## 2015-04-15 LAB — DHEA: DHEA: 53 ng/dL — AB (ref 61–1636)

## 2015-06-01 ENCOUNTER — Telehealth: Payer: Self-pay | Admitting: Family

## 2015-06-01 DIAGNOSIS — J019 Acute sinusitis, unspecified: Secondary | ICD-10-CM

## 2015-06-01 MED ORDER — AMOXICILLIN-POT CLAVULANATE 875-125 MG PO TABS: 1.0000 | ORAL_TABLET | Freq: Two times a day (BID) | ORAL | Status: DC

## 2015-06-01 NOTE — Progress Notes (Signed)

## 2016-04-13 ENCOUNTER — Ambulatory Visit: Payer: BLUE CROSS/BLUE SHIELD

## 2016-04-27 ENCOUNTER — Ambulatory Visit (INDEPENDENT_AMBULATORY_CARE_PROVIDER_SITE_OTHER): Payer: BLUE CROSS/BLUE SHIELD | Admitting: Primary Care

## 2016-04-27 ENCOUNTER — Encounter: Payer: Self-pay | Admitting: Primary Care

## 2016-04-27 VITALS — BP 124/78 | HR 89 | Temp 98.3°F | Ht 72.0 in | Wt 219.1 lb

## 2016-04-27 DIAGNOSIS — Z7689 Persons encountering health services in other specified circumstances: Secondary | ICD-10-CM | POA: Diagnosis not present

## 2016-04-27 DIAGNOSIS — I10 Essential (primary) hypertension: Secondary | ICD-10-CM

## 2016-04-27 DIAGNOSIS — E782 Mixed hyperlipidemia: Secondary | ICD-10-CM

## 2016-04-27 NOTE — Progress Notes (Signed)
Subjective:    Patient ID: Willie Good, male    DOB: Sep 06, 1960, 56 y.o.   MRN: [id number]  HPI  Mr. Klingsporn is a 56 year old male who presents today to establish care and discuss the problems mentioned below. Will obtain old records. His last colonoscopy was 8 years ago, due in 2 years. PSA recently completed through cardiologist.   1) Family History of CAD: He follows with cardiology through [facility]. He has a strong family history of heart disease including heart attacks in males in their mid 38's. He underwent echo stress test and ECG last week. They manage him annually for prevention given family history.   He has lost 50 pounds in the prior years due to healthy diet and exercise. He is currently taking Fish Oil and a multi-vitamin daily.   2) Essential Hypertension: Previously managed on Losartan 50 mg for a brief period of time but has not taken recently. His BP in the clinic today is 124/78. He was able to reduce BP through diet and exercise.  Review of Systems  Constitutional: Negative for fatigue.  Eyes: Negative for visual disturbance.  Respiratory: Negative for shortness of breath.   Cardiovascular: Negative for chest pain and leg swelling.  Neurological: Negative for dizziness and headaches.       Past Medical History:  Diagnosis Date  . Allergy   . Essential hypertension      Social History   Social History  . Marital status: Married    Spouse name: N/A  . Number of children: N/A  . Years of education: N/A   Occupational History  . Not on file.   Social History Main Topics  . Smoking status: Never Smoker  . Smokeless tobacco: Never Used  . Alcohol use Yes  . Drug use: No  . Sexual activity: Not on file   Other Topics Concern  . Not on file   Social History Narrative   Married.   3 children.   Works for [employer].   Enjoys spending time with family, going to [employer], bike riding.    Past Surgical History:  Procedure Laterality Date  .  APPENDECTOMY      Family History  Problem Relation Age of Onset  . Breast cancer Mother   . Diabetes Father   . Heart disease Father   . Hyperlipidemia Father   . Hypertension Father   . Diabetes Sister   . Cancer Maternal Grandmother   . Cancer Maternal Grandfather   . Cancer Paternal Grandmother   . Diabetes Paternal Grandfather   . Heart disease Paternal Grandfather   . Hyperlipidemia Paternal Grandfather   . Hypertension Paternal Grandfather   . Stroke Paternal Grandfather     No Known Allergies  Current Outpatient Prescriptions on File Prior to Visit  Medication Sig Dispense Refill  . Multiple Vitamins-Minerals (MULTIVITAMIN WITH MINERALS) tablet Take 1 tablet by mouth daily.     No current facility-administered medications on file prior to visit.     BP 124/78   Pulse 89   Temp 98.3 F (36.8 C) (Oral)   Ht 6' (1.829 m)   Wt 219 lb 1.9 oz (99.4 kg)   SpO2 96%   BMI 29.72 kg/m    Objective:   Physical Exam  Constitutional: He is oriented to person, place, and time. He appears well-nourished.  Neck: Neck supple.  Cardiovascular: Normal rate and regular rhythm.   Pulmonary/Chest: Effort normal and breath sounds normal. He has no  wheezes. He has no rales.  Neurological: He is alert and oriented to person, place, and time.  Skin: Skin is warm and dry.  Psychiatric: He has a normal mood and affect.          Assessment & Plan:

## 2016-04-27 NOTE — Patient Instructions (Addendum)
Follow up in 1 year for an annual physical.  Start exercising. You should be getting 150 minutes of moderate intensity exercise weekly.  Continue working on a healthy diet.  It was a pleasure to meet you today! Please don't hesitate to call me with any questions. Welcome to [facility]!

## 2016-04-27 NOTE — Assessment & Plan Note (Signed)
Previously managed on losartan 50 mg. BP stable today. Encouraged continued weight loss through healthy diet and exercise. Continue to monitor.

## 2016-04-27 NOTE — Progress Notes (Signed)
Pre visit review using our clinic review tool, if applicable. No additional management support is needed unless otherwise documented below in the visit note. 

## 2016-04-27 NOTE — Assessment & Plan Note (Signed)
Recent lipid panel per cardiology unremarkable. Continue to monitor. Discussed the importance of a healthy diet and regular exercise in order for weight loss, and to reduce the risk of other medical diseases.

## 2016-10-22 ENCOUNTER — Other Ambulatory Visit: Payer: Self-pay | Admitting: Primary Care

## 2016-10-22 ENCOUNTER — Encounter: Payer: Self-pay | Admitting: Primary Care

## 2016-10-22 ENCOUNTER — Ambulatory Visit (INDEPENDENT_AMBULATORY_CARE_PROVIDER_SITE_OTHER): Payer: BLUE CROSS/BLUE SHIELD | Admitting: Primary Care

## 2016-10-22 VITALS — BP 140/86 | HR 71 | Temp 97.9°F | Ht 72.0 in | Wt 210.8 lb

## 2016-10-22 DIAGNOSIS — R195 Other fecal abnormalities: Secondary | ICD-10-CM

## 2016-10-22 DIAGNOSIS — E785 Hyperlipidemia, unspecified: Secondary | ICD-10-CM

## 2016-10-22 DIAGNOSIS — N289 Disorder of kidney and ureter, unspecified: Secondary | ICD-10-CM

## 2016-10-22 LAB — CBC
HCT: 45.3 % (ref 39.0–52.0)
HEMOGLOBIN: 15.3 g/dL (ref 13.0–17.0)
MCHC: 33.7 g/dL (ref 30.0–36.0)
MCV: 89.1 fl (ref 78.0–100.0)
PLATELETS: 366 10*3/uL (ref 150.0–400.0)
RBC: 5.08 Mil/uL (ref 4.22–5.81)
RDW: 12.8 % (ref 11.5–15.5)
WBC: 5.7 10*3/uL (ref 4.0–10.5)

## 2016-10-22 LAB — LIPID PANEL
CHOLESTEROL: 241 mg/dL — AB (ref 0–200)
HDL: 48.2 mg/dL (ref >39.00)
LDL CALC: 177 mg/dL — AB (ref 0–99)
NonHDL: 192.98
Total CHOL/HDL Ratio: 5
Triglycerides: 81 mg/dL (ref 0.0–149.0)
VLDL: 16.2 mg/dL (ref 0.0–40.0)

## 2016-10-22 LAB — COMPREHENSIVE METABOLIC PANEL
ALBUMIN: 4.6 g/dL (ref 3.5–5.2)
ALK PHOS: 60 U/L (ref 39–117)
ALT: 12 U/L (ref 0–53)
AST: 18 U/L (ref 0–37)
BUN: 17 mg/dL (ref 6–23)
CO2: 32 mEq/L (ref 19–32)
CREATININE: 1.46 mg/dL (ref 0.40–1.50)
Calcium: 10 mg/dL (ref 8.4–10.5)
Chloride: 98 mEq/L (ref 96–112)
GFR: 52.94 mL/min — ABNORMAL LOW (ref >60.00)
Glucose, Bld: 85 mg/dL (ref 70–99)
Potassium: 4.4 mEq/L (ref 3.5–5.1)
SODIUM: 135 meq/L (ref 135–145)
TOTAL PROTEIN: 7.3 g/dL (ref 6.0–8.3)
Total Bilirubin: 0.5 mg/dL (ref 0.2–1.2)

## 2016-10-22 NOTE — Patient Instructions (Signed)
Complete lab work prior to leaving today. I will notify you of your results once received.   Monitor your blood pressure as discussed. A reading of 130/90 is too high.  Start exercising. You should be getting 150 minutes of moderate intensity exercise weekly.  It was a pleasure to see you today!   DASH Eating Plan DASH stands for "Dietary Approaches to Stop Hypertension." The DASH eating plan is a healthy eating plan that has been shown to reduce high blood pressure (hypertension). It may also reduce your risk for type 2 diabetes, heart disease, and stroke. The DASH eating plan may also help with weight loss. What are tips for following this plan? General guidelines  Avoid eating more than 2,300 mg (milligrams) of salt (sodium) a day. If you have hypertension, you may need to reduce your sodium intake to 1,500 mg a day.  Limit alcohol intake to no more than 1 drink a day for nonpregnant women and 2 drinks a day for men. One drink equals 12 oz of beer, 5 oz of wine, or 1 oz of hard liquor.  Work with your health care provider to maintain a healthy body weight or to lose weight. Ask what an ideal weight is for you.  Get at least 30 minutes of exercise that causes your heart to beat faster (aerobic exercise) most days of the week. Activities may include walking, swimming, or biking.  Work with your health care provider or diet and nutrition specialist (dietitian) to adjust your eating plan to your individual calorie needs. Reading food labels  Check food labels for the amount of sodium per serving. Choose foods with less than 5 percent of the Daily Value of sodium. Generally, foods with less than 300 mg of sodium per serving fit into this eating plan.  To find whole grains, look for the word "whole" as the first word in the ingredient list. Shopping  Buy products labeled as "low-sodium" or "no salt added."  Buy fresh foods. Avoid canned foods and premade or frozen  meals. Cooking  Avoid adding salt when cooking. Use salt-free seasonings or herbs instead of table salt or sea salt. Check with your health care provider or pharmacist before using salt substitutes.  Do not fry foods. Cook foods using healthy methods such as baking, boiling, grilling, and broiling instead.  Cook with heart-healthy oils, such as olive, canola, soybean, or sunflower oil. Meal planning   Eat a balanced diet that includes: ? 5 or more servings of fruits and vegetables each day. At each meal, try to fill half of your plate with fruits and vegetables. ? Up to 6-8 servings of whole grains each day. ? Less than 6 oz of lean meat, poultry, or fish each day. A 3-oz serving of meat is about the same size as a deck of cards. One egg equals 1 oz. ? 2 servings of low-fat dairy each day. ? A serving of nuts, seeds, or beans 5 times each week. ? Heart-healthy fats. Healthy fats called Omega-3 fatty acids are found in foods such as flaxseeds and coldwater fish, like sardines, salmon, and mackerel.  Limit how much you eat of the following: ? Canned or prepackaged foods. ? Food that is high in trans fat, such as fried foods. ? Food that is high in saturated fat, such as fatty meat. ? Sweets, desserts, sugary drinks, and other foods with added sugar. ? Full-fat dairy products.  Do not salt foods before eating.  Try to eat at least  2 vegetarian meals each week.  Eat more home-cooked food and less restaurant, buffet, and fast food.  When eating at a restaurant, ask that your food be prepared with less salt or no salt, if possible. What foods are recommended? The items listed may not be a complete list. Talk with your dietitian about what dietary choices are best for you. Grains Whole-grain or whole-wheat bread. Whole-grain or whole-wheat pasta. Brown rice. [place]. Bulgur. Whole-grain and low-sodium cereals. Pita bread. Low-fat, low-sodium crackers. Whole-wheat flour  tortillas. Vegetables Fresh or frozen vegetables (raw, steamed, roasted, or grilled). Low-sodium or reduced-sodium tomato and vegetable juice. Low-sodium or reduced-sodium tomato sauce and tomato paste. Low-sodium or reduced-sodium canned vegetables. Fruits All fresh, dried, or frozen fruit. Canned fruit in natural juice (without added sugar). Meat and other protein foods Skinless chicken or [place]. Ground chicken or [place]. Pork with fat trimmed off. Fish and seafood. Egg whites. Dried beans, peas, or lentils. Unsalted nuts, nut butters, and seeds. Unsalted canned beans. Lean cuts of beef with fat trimmed off. Low-sodium, lean deli meat. Dairy Low-fat (1%) or fat-free (skim) milk. Fat-free, low-fat, or reduced-fat cheeses. Nonfat, low-sodium ricotta or cottage cheese. Low-fat or nonfat yogurt. Low-fat, low-sodium cheese. Fats and oils Soft margarine without trans fats. Vegetable oil. Low-fat, reduced-fat, or light mayonnaise and salad dressings (reduced-sodium). Canola, safflower, olive, soybean, and sunflower oils. Avocado. Seasoning and other foods Herbs. Spices. Seasoning mixes without salt. Unsalted popcorn and pretzels. Fat-free sweets. What foods are not recommended? The items listed may not be a complete list. Talk with your dietitian about what dietary choices are best for you. Grains Baked goods made with fat, such as croissants, muffins, or some breads. Dry pasta or rice meal packs. Vegetables Creamed or fried vegetables. Vegetables in a cheese sauce. Regular canned vegetables (not low-sodium or reduced-sodium). Regular canned tomato sauce and paste (not low-sodium or reduced-sodium). Regular tomato and vegetable juice (not low-sodium or reduced-sodium). Angie Fava. Olives. Fruits Canned fruit in a light or heavy syrup. Fried fruit. Fruit in cream or butter sauce. Meat and other protein foods Fatty cuts of meat. Ribs. Fried meat. Berniece Salines. Sausage. Bologna and other processed lunch meats.  Salami. Fatback. Hotdogs. Bratwurst. Salted nuts and seeds. Canned beans with added salt. Canned or smoked fish. Whole eggs or egg yolks. Chicken or [place] with skin. Dairy Whole or 2% milk, cream, and half-and-half. Whole or full-fat cream cheese. Whole-fat or sweetened yogurt. Full-fat cheese. Nondairy creamers. Whipped toppings. Processed cheese and cheese spreads. Fats and oils Butter. Stick margarine. Lard. Shortening. Ghee. Bacon fat. Tropical oils, such as coconut, palm kernel, or palm oil. Seasoning and other foods Salted popcorn and pretzels. Onion salt, garlic salt, seasoned salt, table salt, and sea salt. Worcestershire sauce. Tartar sauce. Barbecue sauce. Teriyaki sauce. Soy sauce, including reduced-sodium. Steak sauce. Canned and packaged gravies. Fish sauce. Oyster sauce. Cocktail sauce. Horseradish that you find on the shelf. Ketchup. Mustard. Meat flavorings and tenderizers. Bouillon cubes. Hot sauce and Tabasco sauce. Premade or packaged marinades. Premade or packaged taco seasonings. Relishes. Regular salad dressings. Where to find more information:  National Heart, Lung, and [place]: https://wilson-eaton.com/  American Heart Association: www.heart.org Summary  The DASH eating plan is a healthy eating plan that has been shown to reduce high blood pressure (hypertension). It may also reduce your risk for type 2 diabetes, heart disease, and stroke.  With the DASH eating plan, you should limit salt (sodium) intake to 2,300 mg a day. If you have hypertension, you may  need to reduce your sodium intake to 1,500 mg a day.  When on the DASH eating plan, aim to eat more fresh fruits and vegetables, whole grains, lean proteins, low-fat dairy, and heart-healthy fats.  Work with your health care provider or diet and nutrition specialist (dietitian) to adjust your eating plan to your individual calorie needs. This information is not intended to replace advice given to you by your health  care provider. Make sure you discuss any questions you have with your health care provider. Document Released: 01/28/2011 Document Revised: 02/02/2016 Document Reviewed: 02/02/2016 Elsevier Interactive Patient Education  2017 [employer].

## 2016-10-22 NOTE — Progress Notes (Signed)
Subjective:    Patient ID: Willie Good, male    DOB: 1960/12/03, 56 y.o.   MRN: [id number]  HPI  Mr. Taboada is a 56 year old male with a history of hyperlipidemia and hypertension that is diet controlled who presents today with a chief complaint of light/pale colored stools. He also endorses "snake like" shaped stools. His symptoms began 2 weeks ago, lasted 1 day, then returned to normal. Five days ago the pale color returned with 2 episodes. Since those two episodes earlier this week he's experienced normal color and size stools. He denies rectal bleeding, fatigue, abdominal pain, weakness, diarrhea, changes in diet. He doubled up on his Niacin several weeks prior to his symptoms. Since then he's resumed his normal dose of Niacin. Last colonoscopy was in 2014, due again in 2024.  Review of Systems  Constitutional: Negative for fatigue.  Respiratory: Negative for shortness of breath.   Cardiovascular: Negative for chest pain.  Gastrointestinal: Negative for abdominal pain, blood in stool, constipation, diarrhea, nausea, rectal pain and vomiting.       Light colored and snake like shaped stool  Neurological: Negative for dizziness and weakness.       Past Medical History:  Diagnosis Date  . Allergy   . Essential hypertension      Social History   Social History  . Marital status: Married    Spouse name: N/A  . Number of children: N/A  . Years of education: N/A   Occupational History  . Not on file.   Social History Main Topics  . Smoking status: Never Smoker  . Smokeless tobacco: Never Used  . Alcohol use Yes  . Drug use: No  . Sexual activity: Not on file   Other Topics Concern  . Not on file   Social History Narrative   Married.   3 children.   Works for [employer].   Enjoys spending time with family, going to [employer], bike riding.    Past Surgical History:  Procedure Laterality Date  . APPENDECTOMY      Family History  Problem Relation Age of Onset  .  Breast cancer Mother   . Diabetes Father   . Heart disease Father   . Hyperlipidemia Father   . Hypertension Father   . Diabetes Sister   . Cancer Maternal Grandmother   . Cancer Maternal Grandfather   . Cancer Paternal Grandmother   . Diabetes Paternal Grandfather   . Heart disease Paternal Grandfather   . Hyperlipidemia Paternal Grandfather   . Hypertension Paternal Grandfather   . Stroke Paternal Grandfather     No Known Allergies  Current Outpatient Prescriptions on File Prior to Visit  Medication Sig Dispense Refill  . Multiple Vitamins-Minerals (MULTIVITAMIN WITH MINERALS) tablet Take 1 tablet by mouth daily.    . Omega-3 Fatty Acids (FISH OIL PO) Take 2,000 mg by mouth daily.      No current facility-administered medications on file prior to visit.     BP 140/86   Pulse 71   Temp 97.9 F (36.6 C) (Oral)   Ht 6' (1.829 m)   Wt 210 lb 12.8 oz (95.6 kg)   SpO2 98%   BMI 28.59 kg/m    Objective:   Physical Exam  Constitutional: He appears well-nourished.  Neck: Neck supple.  Cardiovascular: Normal rate and regular rhythm.   Pulmonary/Chest: Effort normal and breath sounds normal.  Abdominal: Soft. Bowel sounds are normal. There is no tenderness.  Skin: Skin is  warm and dry.          Assessment & Plan:  Abnormal Stool:  Occurred twice within the past 2 weeks. Normal stool shape and color over the last 5 days. No alarm signs. Colonoscopy UTD, due in 2024. Exam unremarkable. Check CBC, CMP today for further evaluation.  He will update if symptoms change.  Sheral Flow, NP

## 2017-01-28 ENCOUNTER — Other Ambulatory Visit (INDEPENDENT_AMBULATORY_CARE_PROVIDER_SITE_OTHER): Payer: BLUE CROSS/BLUE SHIELD

## 2017-01-28 DIAGNOSIS — N289 Disorder of kidney and ureter, unspecified: Secondary | ICD-10-CM

## 2017-01-28 DIAGNOSIS — E785 Hyperlipidemia, unspecified: Secondary | ICD-10-CM | POA: Diagnosis not present

## 2017-01-28 LAB — BASIC METABOLIC PANEL
BUN: 6 mg/dL (ref 6–23)
CO2: 30 mEq/L (ref 19–32)
Calcium: 9.4 mg/dL (ref 8.4–10.5)
Chloride: 96 mEq/L (ref 96–112)
Creatinine, Ser: 1.25 mg/dL (ref 0.40–1.50)
GFR: 63.27 mL/min (ref >60.00)
GLUCOSE: 82 mg/dL (ref 70–99)
POTASSIUM: 4.1 meq/L (ref 3.5–5.1)
Sodium: 133 mEq/L — ABNORMAL LOW (ref 135–145)

## 2017-01-28 LAB — LIPID PANEL
Cholesterol: 182 mg/dL (ref 0–200)
HDL: 39.7 mg/dL (ref >39.00)
LDL Cholesterol: 123 mg/dL — ABNORMAL HIGH (ref 0–99)
NONHDL: 142.01
Total CHOL/HDL Ratio: 5
Triglycerides: 94 mg/dL (ref 0.0–149.0)
VLDL: 18.8 mg/dL (ref 0.0–40.0)

## 2017-03-08 ENCOUNTER — Telehealth: Payer: Self-pay

## 2017-03-08 NOTE — Telephone Encounter (Signed)
Pt last seen 10/22/16.Please advise.

## 2017-03-08 NOTE — Telephone Encounter (Signed)
Looks like he will be due for a physical this March, I do recommend an annual physical to check overall health which includes labs and exam.  This is a good way to catch/prevent chronic illnesses.  Please schedule if he is willing.  Looks like his colonoscopy is up-to-date, this is a good way to screen for colon cancer. What type of Dr. did his sister see?  Was as a geneticist?  I am happy to send him if he'd like to go.

## 2017-03-08 NOTE — Telephone Encounter (Signed)
Copied from [place] [phone]. Topic: General - Other >> Mar 08, 2017  9:30 AM Willie Good wrote: Reason for CRM: Patient's sister recently had genetic testing that resulted "high risk probability for breast cancer and elevated risk for colorectal cancer". The company tld her she needs to inform all of her brothers and sisters. Patient wants to know if he can have genetic testing done to find out what his risk is for these as well. Please advise on whether or not he can have this done in office or if NP Clark needs to refer him out.

## 2017-03-10 NOTE — Telephone Encounter (Signed)
Message left for patient to return my call.  Message was also sent through [place].

## 2017-03-11 NOTE — Telephone Encounter (Signed)
Patient called called in to speak to [place] about the message he received in [place], I spoke to [facility] and she wanted him to know he will need to schedule a physical in March, I informed the patient, appointment made for 04/22/17.

## 2017-04-22 ENCOUNTER — Ambulatory Visit (INDEPENDENT_AMBULATORY_CARE_PROVIDER_SITE_OTHER): Payer: BLUE CROSS/BLUE SHIELD | Admitting: Primary Care

## 2017-04-22 VITALS — BP 134/84 | HR 76 | Temp 97.6°F | Ht 72.0 in | Wt 187.5 lb

## 2017-04-22 DIAGNOSIS — Z0000 Encounter for general adult medical examination without abnormal findings: Secondary | ICD-10-CM

## 2017-04-22 DIAGNOSIS — Z1159 Encounter for screening for other viral diseases: Secondary | ICD-10-CM | POA: Diagnosis not present

## 2017-04-22 DIAGNOSIS — I10 Essential (primary) hypertension: Secondary | ICD-10-CM | POA: Diagnosis not present

## 2017-04-22 DIAGNOSIS — E782 Mixed hyperlipidemia: Secondary | ICD-10-CM

## 2017-04-22 LAB — LIPID PANEL
Cholesterol: 195 mg/dL (ref 0–200)
HDL: 57.7 mg/dL (ref >39.00)
LDL Cholesterol: 126 mg/dL — ABNORMAL HIGH (ref 0–99)
NonHDL: 137.27
TRIGLYCERIDES: 58 mg/dL (ref 0.0–149.0)
Total CHOL/HDL Ratio: 3
VLDL: 11.6 mg/dL (ref 0.0–40.0)

## 2017-04-22 LAB — COMPREHENSIVE METABOLIC PANEL
ALK PHOS: 59 U/L (ref 39–117)
ALT: 12 U/L (ref 0–53)
AST: 20 U/L (ref 0–37)
Albumin: 4.2 g/dL (ref 3.5–5.2)
BILIRUBIN TOTAL: 0.6 mg/dL (ref 0.2–1.2)
BUN: 8 mg/dL (ref 6–23)
CALCIUM: 10 mg/dL (ref 8.4–10.5)
CO2: 30 meq/L (ref 19–32)
Chloride: 99 mEq/L (ref 96–112)
Creatinine, Ser: 1.28 mg/dL (ref 0.40–1.50)
GFR: 61.51 mL/min (ref >60.00)
Glucose, Bld: 73 mg/dL (ref 70–99)
Potassium: 4 mEq/L (ref 3.5–5.1)
Sodium: 138 mEq/L (ref 135–145)
TOTAL PROTEIN: 7.2 g/dL (ref 6.0–8.3)

## 2017-04-22 NOTE — Assessment & Plan Note (Signed)
Repeat lipids pending. Commended him on weight loss efforts. Recommended he increase vegetables, whole grains, lean protein.

## 2017-04-22 NOTE — Assessment & Plan Note (Signed)
Working nights now, monitoring BP at home with readings of 120's/70's. Continue to work on healthy lifestyle changes.

## 2017-04-22 NOTE — Assessment & Plan Note (Signed)
Td over due, declines. Declines influenza vaccination. PSA UTD.  Hep C pending. Commended him on weight loss through diet and regular exercise. Encouraged to continue. Exam unremarkable. Labs pending. Follow up in 1 year.

## 2017-04-22 NOTE — Patient Instructions (Addendum)
Stop by the lab prior to leaving today. I will notify you of your results once received.   Continue exercising. You should be getting 150 minutes of moderate intensity exercise weekly.  Increase consumption of vegetables, fruit, whole grains, lean protein.  Ensure you are consuming 64 ounces of water daily.  Follow up with your cardiologist as scheduled.   Follow up with [place] in one year for your annual exam or sooner if needed.  It was a pleasure to see you today!

## 2017-04-22 NOTE — Progress Notes (Signed)
Subjective:    Patient ID: Willie Good, male    DOB: 01/20/1961, 57 y.o.   MRN: [id number]  HPI  Willie Good is a 57 year old male who presents today for complete physical.  Immunizations: -Tetanus: Unsure, believes it's over 10 years. Declines today. -Influenza: Declines    Diet: He endorses a fair diet.  Breakfast: Blueberries (3 pounds at a time) scrambled egg or whey protein powder. over his fruit most days of the week. Saturdays: egg and muffin Lunch: None during the week. Saturdays: Large meal out Dinner: None during the week. Saturdays: Large meal out Snacks: None during the week. Saturday: Cracker Jacks Desserts: None during the week. Saturday: Pie/cake Beverages: Juice, water, diet tea  Exercise: He is exercising 7 days weekly for about 30-60 minutes.  Eye exam: Completed several years ago.  Dental exam: Completed 2 years ago.  Colonoscopy: Completed in 2010 PSA: Negative in 2017 Hep C Screen: Due today   Review of Systems  Constitutional: Negative for unexpected weight change.  HENT: Negative for rhinorrhea.   Respiratory: Negative for cough and shortness of breath.   Cardiovascular: Negative for chest pain.  Gastrointestinal: Negative for constipation and diarrhea.  Genitourinary: Negative for difficulty urinating.  Musculoskeletal: Negative for arthralgias and myalgias.  Skin: Negative for rash.  Allergic/Immunologic: Negative for environmental allergies.  Neurological: Negative for dizziness, numbness and headaches.  Psychiatric/Behavioral: The patient is not nervous/anxious.        Past Medical History:  Diagnosis Date  . Allergy   . Essential hypertension      Social History   Socioeconomic History  . Marital status: Married    Willie Good  . Number of children: Not on Good  . Years of education: Not on Good  . Highest education level: Not on Good  Social Needs  . Financial resource strain: Not on Good  . Food insecurity -  worry: Not on Good  . Food insecurity - inability: Not on Good  . Transportation needs - medical: Not on Good  . Transportation needs - non-medical: Not on Good  Occupational History  . Not on Good  Tobacco Use  . Smoking status: Never Smoker  . Smokeless tobacco: Never Used  Substance and Sexual Activity  . Alcohol use: Yes  . Drug use: No  . Sexual activity: Not on Good  Other Topics Concern  . Not on Good  Social History Narrative   Married.   3 children.   Works for [employer].   Enjoys spending time with family, going to [employer], bike riding.    Past Surgical History:  Procedure Laterality Date  . APPENDECTOMY      Family History  Problem Relation Age of Onset  . Breast cancer Mother   . Diabetes Father   . Heart disease Father   . Hyperlipidemia Father   . Hypertension Father   . Diabetes Sister   . Cancer Maternal Grandmother   . Cancer Maternal Grandfather   . Cancer Paternal Grandmother   . Diabetes Paternal Grandfather   . Heart disease Paternal Grandfather   . Hyperlipidemia Paternal Grandfather   . Hypertension Paternal Grandfather   . Stroke Paternal Grandfather     No Known Allergies  Current Outpatient Medications on Good Prior to Visit  Medication Sig Dispense Refill  . Multiple Vitamins-Minerals (MULTIVITAMIN WITH MINERALS) tablet Take 1 tablet by mouth daily.    . Omega-3 Fatty Acids (FISH OIL PO) Take 2,000 mg by mouth  daily.      No current facility-administered medications on Good prior to visit.     BP 134/84   Pulse 76   Temp 97.6 F (36.4 C) (Oral)   Ht 6' (1.829 m)   Wt 187 lb 8 oz (85 kg)   SpO2 97%   BMI 25.43 kg/m    Objective:   Physical Exam  Constitutional: He is oriented to person, place, and time. He appears well-nourished.  HENT:  Right Ear: Tympanic membrane and ear canal normal.  Left Ear: Tympanic membrane and ear canal normal.  Nose: Nose normal. Right sinus exhibits no maxillary sinus tenderness and no frontal  sinus tenderness. Left sinus exhibits no maxillary sinus tenderness and no frontal sinus tenderness.  Mouth/Throat: Oropharynx is clear and moist.  Eyes: Conjunctivae and EOM are normal. Pupils are equal, round, and reactive to light.  Neck: Neck supple. Carotid bruit is not present. No thyromegaly present.  Cardiovascular: Normal rate, regular rhythm and normal heart sounds.  Pulmonary/Chest: Effort normal and breath sounds normal. He has no wheezes. He has no rales.  Abdominal: Soft. Bowel sounds are normal. There is no tenderness.  Musculoskeletal: Normal range of motion.  Neurological: He is alert and oriented to person, place, and time. He has normal reflexes. No cranial nerve deficit.  Skin: Skin is warm and dry.  Psychiatric: He has a normal mood and affect.          Assessment & Plan:

## 2017-04-23 LAB — HEPATITIS C ANTIBODY
Hepatitis C Ab: NONREACTIVE
SIGNAL TO CUT-OFF: 0.01 (ref <1.00)

## 2017-06-21 ENCOUNTER — Other Ambulatory Visit: Payer: Self-pay

## 2017-06-21 ENCOUNTER — Emergency Department
Admission: EM | Admit: 2017-06-21 | Discharge: 2017-06-21 | Disposition: A | Discharge status: Home / Self Care | Payer: BLUE CROSS/BLUE SHIELD | Source: Home / Self Care | Attending: Emergency Medicine | Admitting: Emergency Medicine

## 2017-06-21 ENCOUNTER — Emergency Department: Payer: BLUE CROSS/BLUE SHIELD

## 2017-06-21 DIAGNOSIS — Z79899 Other long term (current) drug therapy: Secondary | ICD-10-CM | POA: Insufficient documentation

## 2017-06-21 DIAGNOSIS — R7989 Other specified abnormal findings of blood chemistry: Secondary | ICD-10-CM

## 2017-06-21 DIAGNOSIS — I10 Essential (primary) hypertension: Secondary | ICD-10-CM | POA: Insufficient documentation

## 2017-06-21 DIAGNOSIS — N3001 Acute cystitis with hematuria: Secondary | ICD-10-CM

## 2017-06-21 DIAGNOSIS — N401 Enlarged prostate with lower urinary tract symptoms: Secondary | ICD-10-CM | POA: Insufficient documentation

## 2017-06-21 DIAGNOSIS — R7881 Bacteremia: Secondary | ICD-10-CM | POA: Diagnosis not present

## 2017-06-21 DIAGNOSIS — R748 Abnormal levels of other serum enzymes: Secondary | ICD-10-CM

## 2017-06-21 DIAGNOSIS — R778 Other specified abnormalities of plasma proteins: Secondary | ICD-10-CM

## 2017-06-21 DIAGNOSIS — R338 Other retention of urine: Secondary | ICD-10-CM

## 2017-06-21 DIAGNOSIS — R42 Dizziness and giddiness: Secondary | ICD-10-CM | POA: Insufficient documentation

## 2017-06-21 DIAGNOSIS — A4159 Other Gram-negative sepsis: Secondary | ICD-10-CM | POA: Diagnosis not present

## 2017-06-21 LAB — COMPREHENSIVE METABOLIC PANEL
ALT: 45 U/L (ref 17–63)
AST: 61 U/L — ABNORMAL HIGH (ref 15–41)
Albumin: 3.2 g/dL — ABNORMAL LOW (ref 3.5–5.0)
Alkaline Phosphatase: 63 U/L (ref 38–126)
Anion gap: 8 (ref 5–15)
BUN: 13 mg/dL (ref 6–20)
CALCIUM: 8.5 mg/dL — AB (ref 8.9–10.3)
CHLORIDE: 103 mmol/L (ref 101–111)
CO2: 25 mmol/L (ref 22–32)
CREATININE: 1.22 mg/dL (ref 0.61–1.24)
Glucose, Bld: 137 mg/dL — ABNORMAL HIGH (ref 65–99)
Potassium: 3.3 mmol/L — ABNORMAL LOW (ref 3.5–5.1)
Sodium: 136 mmol/L (ref 135–145)
Total Bilirubin: 0.7 mg/dL (ref 0.3–1.2)
Total Protein: 6.2 g/dL — ABNORMAL LOW (ref 6.5–8.1)

## 2017-06-21 LAB — CBC WITH DIFFERENTIAL/PLATELET
Basophils Absolute: 0 10*3/uL (ref 0–0.1)
Basophils Relative: 0 %
Eosinophils Absolute: 0 10*3/uL (ref 0–0.7)
Eosinophils Relative: 0 %
HEMATOCRIT: 40.2 % (ref 40.0–52.0)
HEMOGLOBIN: 13.9 g/dL (ref 13.0–18.0)
LYMPHS ABS: 0.7 10*3/uL — AB (ref 1.0–3.6)
Lymphocytes Relative: 6 %
MCH: 29.8 pg (ref 26.0–34.0)
MCHC: 34.7 g/dL (ref 32.0–36.0)
MCV: 85.9 fL (ref 80.0–100.0)
Monocytes Absolute: 0.4 10*3/uL (ref 0.2–1.0)
Monocytes Relative: 3 %
NEUTROS ABS: 10.9 10*3/uL — AB (ref 1.4–6.5)
NEUTROS PCT: 91 %
Platelets: 378 10*3/uL (ref 150–440)
RBC: 4.68 MIL/uL (ref 4.40–5.90)
RDW: 13.7 % (ref 11.5–14.5)
WBC: 12 10*3/uL — AB (ref 3.8–10.6)

## 2017-06-21 LAB — URINALYSIS, COMPLETE (UACMP) WITH MICROSCOPIC
Bacteria, UA: NONE SEEN
Bilirubin Urine: NEGATIVE
GLUCOSE, UA: NEGATIVE mg/dL
HGB URINE DIPSTICK: NEGATIVE
Ketones, ur: NEGATIVE mg/dL
NITRITE: NEGATIVE
PROTEIN: 30 mg/dL — AB
Specific Gravity, Urine: 1.017 (ref 1.005–1.030)
Squamous Epithelial / LPF: NONE SEEN (ref 0–5)
pH: 5 (ref 5.0–8.0)

## 2017-06-21 LAB — TROPONIN I
Troponin I: 0.31 ng/mL (ref <0.03)
Troponin I: 0.25 ng/mL (ref <0.03)

## 2017-06-21 LAB — LIPASE, BLOOD: LIPASE: 23 U/L (ref 11–51)

## 2017-06-21 LAB — PROTIME-INR
INR: 1.13
Prothrombin Time: 14.4 seconds (ref 11.4–15.2)

## 2017-06-21 LAB — APTT: APTT: 31 s (ref 24–36)

## 2017-06-21 LAB — LACTIC ACID, PLASMA: Lactic Acid, Venous: 1.6 mmol/L (ref 0.5–1.9)

## 2017-06-21 LAB — PROCALCITONIN: PROCALCITONIN: 37.14 ng/mL

## 2017-06-21 MED ORDER — CEFDINIR 300 MG PO CAPS: 300.0000 mg | ORAL_CAPSULE | Freq: Two times a day (BID) | ORAL | 0 refills | Status: DC

## 2017-06-21 MED ORDER — LACTATED RINGERS IV SOLN
1000.0000 mL | Freq: Once | INTRAVENOUS | Status: AC
  Administered 2017-06-21: 1000 mL via INTRAVENOUS

## 2017-06-21 MED ORDER — SODIUM CHLORIDE 0.9 % IV SOLN
INTRAVENOUS | Status: AC
  Filled 2017-06-21: qty 10

## 2017-06-21 MED ORDER — LACTATED RINGERS IV BOLUS
2000.0000 mL | Freq: Once | INTRAVENOUS | Status: AC
  Administered 2017-06-21: 2000 mL via INTRAVENOUS
  Filled 2017-06-21: qty 2000

## 2017-06-21 MED ORDER — IOPAMIDOL (ISOVUE-370) INJECTION 76%
75.0000 mL | Freq: Once | INTRAVENOUS | Status: AC | PRN
  Administered 2017-06-21: 75 mL via INTRAVENOUS

## 2017-06-21 MED ORDER — KETOROLAC TROMETHAMINE 30 MG/ML IJ SOLN
15.0000 mg | Freq: Once | INTRAMUSCULAR | Status: AC
  Administered 2017-06-21: 15 mg via INTRAVENOUS
  Filled 2017-06-21: qty 1

## 2017-06-21 MED ORDER — SODIUM CHLORIDE 0.9 % IV SOLN
2.0000 g | INTRAVENOUS | Status: DC
  Administered 2017-06-21: 2 g via INTRAVENOUS
  Filled 2017-06-21 (×2): qty 20

## 2017-06-21 MED ORDER — ASPIRIN EC 81 MG PO TBEC: 81.0000 mg | DELAYED_RELEASE_TABLET | Freq: Every day | ORAL | 1 refills | Status: AC

## 2017-06-21 MED ORDER — LIDOCAINE HCL URETHRAL/MUCOSAL 2 % EX GEL
CUTANEOUS | Status: AC
  Filled 2017-06-21: qty 10

## 2017-06-21 MED ORDER — ACETAMINOPHEN 325 MG PO TABS: 650.0000 mg | ORAL_TABLET | Freq: Four times a day (QID) | ORAL | 0 refills | Status: AC | PRN

## 2017-06-21 NOTE — ED Provider Notes (Signed)
[facility] Emergency Department Provider Note  ____________________________________________  Time seen: Approximately 3:23 PM  I have reviewed the triage vital signs and the nursing notes.   HISTORY  Chief Complaint Urinary Tract Infection    HPI Willie Good is a 57 y.o. male who complains of generalized weakness since yesterday. He is also having decreased urine output and dysuria. This morning after a shower he had a shaking fit with diaphoresis. He did not lose consciousness or have urinary incontinence. No trauma. He went to the pharmacy to get evaluated for a suspected UTI, they found him to be febrile and called EMS to have him evaluated in the emergency department. Patient denies any history of urinary tract infections or neurologic disorder or surgery.  no other significant medical problems. Only surgical history is appendectomy.  Suprapubic abdominal pain is intermittent, occurs when trying to urinate. Nonradiating, severe, sharp.     Past Medical History:  Diagnosis Date  . Allergy   . Essential hypertension      Patient Active Problem List   Diagnosis Date Noted  . Preventative health care 04/22/2017  . Benign essential HTN 04/05/2015  . Combined fat and carbohydrate induced hyperlipemia 05/10/2014     Past Surgical History:  Procedure Laterality Date  . APPENDECTOMY       Prior to Admission medications   Medication Sig Start Date End Date Taking? Authorizing Provider  acetaminophen (TYLENOL) 325 MG tablet Take 2 tablets (650 mg total) by mouth every 6 (six) hours as needed. 06/21/17   Carrie Mew, MD  aspirin EC 81 MG tablet Take 1 tablet (81 mg total) by mouth daily. 06/21/17 08/20/17  Carrie Mew, MD  cefdinir (OMNICEF) 300 MG capsule Take 1 capsule (300 mg total) by mouth 2 (two) times daily. 06/21/17   Carrie Mew, MD  Multiple Vitamins-Minerals (MULTIVITAMIN WITH MINERALS) tablet Take 1 tablet by mouth daily.     [provider]  Omega-3 Fatty Acids (FISH OIL PO) Take 2,000 mg by mouth daily.     [provider]     Allergies Patient has no known allergies.   Family History  Problem Relation Age of Onset  . Breast cancer Mother   . Diabetes Father   . Heart disease Father   . Hyperlipidemia Father   . Hypertension Father   . Diabetes Sister   . Cancer Maternal Grandmother   . Cancer Maternal Grandfather   . Cancer Paternal Grandmother   . Diabetes Paternal Grandfather   . Heart disease Paternal Grandfather   . Hyperlipidemia Paternal Grandfather   . Hypertension Paternal Grandfather   . Stroke Paternal Grandfather     Social History Social History   Tobacco Use  . Smoking status: Never Smoker  . Smokeless tobacco: Never Used  Substance Use Topics  . Alcohol use: Yes  . Drug use: No    Review of Systems  Constitutional:  positive fever and chills.  ENT:   No sore throat. No rhinorrhea. Cardiovascular:   No chest pain or syncope. Respiratory:   No dyspnea or cough. Gastrointestinal:   positive suprapubic abdominal pain without vomiting and diarrhea. positive dysuria. Musculoskeletal:   Negative for focal pain or swelling All other systems reviewed and are negative except as documented above in ROS and HPI.  ____________________________________________   PHYSICAL EXAM:  VITAL SIGNS: ED Triage Vitals  Enc Vitals Group     BP 06/21/17 1337 108/72     Pulse Rate 06/21/17 1337 (!) 118  Resp 06/21/17 1337 20     Temp 06/21/17 1337 (!) 100.8 F (38.2 C)     Temp Source 06/21/17 1337 Oral     SpO2 06/21/17 1330 97 %     Weight 06/21/17 1339 185 lb (83.9 kg)     Height 06/21/17 1339 6\' 1"  (1.854 m)     Head Circumference --      Peak Flow --      Pain Score 06/21/17 1338 10     Pain Loc --      Pain Edu? --      Excl. in [place]? --     Vital signs reviewed, nursing assessments reviewed.   Constitutional:   Alert and oriented. Well appearing and  in no distress. Eyes:   Conjunctivae are normal. EOMI. PERRL. ENT      Head:   Normocephalic and atraumatic.      Nose:   No congestion/rhinnorhea.       Mouth/Throat:   dry mucous membranes, no pharyngeal erythema. No peritonsillar mass.       Neck:   No meningismus. Full ROM. Hematological/Lymphatic/Immunilogical:   No cervical lymphadenopathy. Cardiovascular:   RRR. Symmetric bilateral radial and DP pulses.  No murmurs.  Respiratory:   Normal respiratory effort without tachypnea/retractions. Breath sounds are clear and equal bilaterally. No wheezes/rales/rhonchi. Gastrointestinal:   Soft with suprapubic tenderness. Non distended. There is no CVA tenderness.  No rebound, rigidity, or guarding. Genitourinary:   deferred Musculoskeletal:   Normal range of motion in all extremities. No joint effusions.  No lower extremity tenderness.  No edema. Neurologic:   Normal speech and language.  Motor grossly intact. No acute focal neurologic deficits are appreciated.  Skin:    Skin is warm, dry and intact. No rash noted.  No petechiae, purpura, or bullae.  ____________________________________________    LABS (pertinent positives/negatives) (all labs ordered are listed, but only abnormal results are displayed) Labs Reviewed  CBC WITH DIFFERENTIAL/PLATELET - Abnormal; Notable for the following components:      Result Value   WBC 12.0 (*)    Neutro Abs 10.9 (*)    Lymphs Abs 0.7 (*)    All other components within normal limits  URINALYSIS, COMPLETE (UACMP) WITH MICROSCOPIC - Abnormal; Notable for the following components:   Color, Urine AMBER (*)    APPearance HAZY (*)    Protein, ur 30 (*)    Leukocytes, UA SMALL (*)    All other components within normal limits  COMPREHENSIVE METABOLIC PANEL - Abnormal; Notable for the following components:   Potassium 3.3 (*)    Glucose, Bld 137 (*)    Calcium 8.5 (*)    Total Protein 6.2 (*)    Albumin 3.2 (*)    AST 61 (*)    All other components  within normal limits  TROPONIN I - Abnormal; Notable for the following components:   Troponin I 0.31 (*)    All other components within normal limits  CULTURE, BLOOD (ROUTINE X 2)  CULTURE, BLOOD (ROUTINE X 2)  URINE CULTURE  LACTIC ACID, PLASMA  APTT  PROTIME-INR  LIPASE, BLOOD  PROCALCITONIN  LACTIC ACID, PLASMA  TROPONIN I   ____________________________________________   EKG  interpreted by me Sinus tachycardia rate 119, normal axis intervals QRS ST segments and T waves  ____________________________________________    RADIOLOGY  Ct Head Wo Contrast  Result Date: 06/21/2017 CLINICAL DATA:  Lightheaded and dizziness.  Fell today.  Fever. EXAM: CT HEAD WITHOUT CONTRAST TECHNIQUE: Contiguous  axial images were obtained from the base of the skull through the vertex without intravenous contrast. COMPARISON:  Brain MR dated 02/03/2014 and head CT dated 02/03/2014. FINDINGS: Brain: Normal appearing cerebral hemispheres and posterior fossa structures. Normal size and position of the ventricles. No intracranial hemorrhage, mass lesion or CT evidence of acute infarction. Vascular: No hyperdense vessel or unexpected calcification. Skull: Normal. Negative for fracture or focal lesion. Sinuses/Orbits: Unremarkable. Other: None. IMPRESSION: Normal examination. Electronically Signed   By: Claudie Revering M.D.   On: 06/21/2017 19:32   Ct Abdomen Pelvis W Contrast  Result Date: 06/21/2017 CLINICAL DATA:  Pain and burning with urination for the past 3 days. Fever. Fell today. EXAM: CT ABDOMEN AND PELVIS WITH CONTRAST TECHNIQUE: Multidetector CT imaging of the abdomen and pelvis was performed using the standard protocol following bolus administration of intravenous contrast. CONTRAST:  82mL ISOVUE-370 IOPAMIDOL (ISOVUE-370) INJECTION 76% COMPARISON:  None. FINDINGS: Lower chest: Minimal left lower lobe dependent atelectasis. Hepatobiliary: Diffuse periportal edema, most pronounced in the lateral segment  of the left lower lobe, with mild intrahepatic biliary ductal dilatation, more pronounced in the lateral segment of the left lobe. No extrahepatic biliary dilatation seen. Normal appearing gallbladder. Pancreas: Unremarkable. No pancreatic ductal dilatation or surrounding inflammatory changes. Spleen: Multiple small calcified granulomata. Adrenals/Urinary Tract: Normal appearing adrenal glands. Two tiny right renal cysts. 2.1 cm left renal cyst containing a small amount of coarse curvilinear peripheral calcification. Small exophytic lower pole left renal cyst with mild adjacent edema. Tiny lower pole left renal cyst. Mild diffuse bladder wall thickening. Normal appearing ureters. No ureteral calculi or hydronephrosis seen. Stomach/Bowel: Prominent stool in the colon. Surgically absent appendix. Unremarkable small bowel and stomach. Vascular/Lymphatic: No significant vascular findings are present. No enlarged abdominal or pelvic lymph nodes. Reproductive: Moderately enlarged and mildly heterogeneous prostate gland with protrusion of the median lobe into the base of the urinary bladder. Other: Small bilateral inguinal hernias containing fat. Musculoskeletal: Lumbar and lower thoracic spine degenerative changes. IMPRESSION: 1. Mild intrahepatic biliary ductal dilatation with diffuse periportal edema, most pronounced in the lateral segment of the left lobe. These changes could be due to a centrally obstructing biliary mass and secondary cholangitis or due to hepatitis. 2. Tiny, nonobstructing lower pole left renal calculus. 3. Mild diffuse bladder wall thickening, most likely due to chronic bladder outlet obstruction by the enlarged prostate gland. Cystitis can also produce this appearance. 4. Moderate prostatic hypertrophy. Electronically Signed   By: Claudie Revering M.D.   On: 06/21/2017 19:28   Dg Chest Port 1 View  Result Date: 06/21/2017 CLINICAL DATA:  57 y/o  M; fever. EXAM: PORTABLE CHEST 1 VIEW COMPARISON:   None. FINDINGS: The heart size and mediastinal contours are within normal limits. Both lungs are clear. The visualized skeletal structures are unremarkable. IMPRESSION: No active disease. Electronically Signed   By: Kristine Garbe M.D.   On: 06/21/2017 15:03    ____________________________________________   PROCEDURES .Critical Care Performed by: Carrie Mew, MD Authorized by: Carrie Mew, MD   Critical care provider statement:    Critical care time (minutes):  30   Critical care time was exclusive of:  Separately billable procedures and treating other patients   Critical care was necessary to treat or prevent imminent or life-threatening deterioration of the following conditions:  Sepsis   Critical care was time spent personally by me on the following activities:  Development of treatment plan with patient or surrogate, discussions with consultants, evaluation of patient's response to treatment,  examination of patient, obtaining history from patient or surrogate, ordering and performing treatments and interventions, ordering and review of laboratory studies, ordering and review of radiographic studies, pulse oximetry, re-evaluation of patient's condition and review of old charts    ____________________________________________  DIFFERENTIAL DIAGNOSIS / MDM   urinary retention, cystitis, possible early sepsis, kidney stone.  low suspicion bowel perforation obstruction biliary disease pancreatitis dissection AAA or mesenteric ischemia.  CLINICAL IMPRESSION / ASSESSMENT AND PLAN / ED COURSE  Pertinent labs & imaging results that were available during my care of the patient were reviewed by me and considered in my medical decision making (see chart for details).      Clinical Course as of Jun 22 2047  Tue Jun 21, 2017  1400 patient presents with fever, tachycardia, symptoms of UTI. Blood pressure is normal, good perfusion on exam, code sepsis initiated. IV  ceftriaxone and fluid boluses. Check labs including lactic acid.   [PS]  1502 Cxr image viewed by me. No apparent pna, ptx, or pulm. Infarct. Bones and soft tissues unremarkable.   [PS]  1523 Lactate normal. Still awaiting labs. Cxr report unremarkable.    [PS]  8676 Still awaiting chemistries.. Continue IVF bolus   [PS]  1950 Unclear significance given sx of suprapubic pain and dysuria and fever. Presentation not c/w acs, pe, dissection, myocarditis. Prior troponin in EMR negative in 2015.  Troponin I(!!): 0.31 [PS]  1820 UA suggestive of UTI but not clearly diagnositic. Procalcitonin markedly elevated. Will obtain CT a/p for further evaluation of fever and abd. Pain.    [PS]  1948 CT a/p c/w cystitis and enlarged prostate. Likely he has been having progressive prostatic hypertrophy chronically, causing urinary retention and now UTI. Will place foley to relieve obstruction and facilitate clearance of UTI. Continue abx. Can f/u urology.   If second trop is not ascending, pt can f/u cardiology for further eval of elevated troponin level given lack of cardiac symptoms.   Despite initial abnormal vitals and suspected sepsis, lactate is normal, BP remains normal, and patient is now feeling normal. He is tolerating PO and suitable for DC home as long as OS VS are okay and second trop is not significantly increased.    [PS]  2031 Orthostatic VS negative. Suitable for DC if second trop stable.    [PS]    Clinical Course User Index [PS] Carrie Mew, MD     ____________________________________________   FINAL CLINICAL IMPRESSION(S) / ED DIAGNOSES    Final diagnoses:  Acute cystitis with hematuria  Enlarged prostate with urinary retention  Elevated troponin     ED Discharge Orders        Ordered    cefdinir (OMNICEF) 300 MG capsule  2 times daily     06/21/17 2033    acetaminophen (TYLENOL) 325 MG tablet  Every 6 hours PRN     06/21/17 2033    aspirin EC 81 MG tablet  Daily      06/21/17 2034      Portions of this note were generated with dragon dictation software. Dictation errors may occur despite best attempts at proofreading.    Carrie Mew, MD 06/21/17 2049

## 2017-06-21 NOTE — Discharge Instructions (Signed)
Your evaluation today reveals an enlarged prostate which is causing urinary retention and urinary tract infection. We placed a urinary catheter to relieve the obstruction.  Take antibiotics as prescribed to treat the infection and follow up with urology.    Your tests also showed some signs of heart strain. This is of unclear significance at this point. Please follow up with cardiology for further evaluation.

## 2017-06-21 NOTE — Progress Notes (Signed)
CODE SEPSIS - PHARMACY COMMUNICATION  **Broad Spectrum Antibiotics should be administered within 1 hour of Sepsis diagnosis**  Time Code Sepsis Called/Page Received: 1400  Antibiotics Ordered: Rocephin 2 grams  Time of 1st antibiotic administration: 0177  Additional action taken by pharmacy: none  If necessary, Name of Provider/Nurse Contacted: N/A    Dallie Piles ,PharmD Clinical Pharmacist  06/21/2017  2:52 PM

## 2017-06-21 NOTE — ED Triage Notes (Signed)
Pt arrives via ems from [facility] with reports of a possible UTI. EMS states patient started having pain and burning with urination since Saturday. Pt states he got out of the shower this morning and fell to floor, started shaking uncontrollably and decided to go to clinic. [facility] slightly orthostatic there with a temp of 101.9. Pt states he is unable to urinate today and currently fells lightheaded and dizzy when up

## 2017-06-21 NOTE — ED Notes (Signed)
Date and time results received: 06/21/17 4:35 PM  Test: troponin Critical Value: 0.31  Name of Provider Notified: Dr. Joni Fears  Orders Received? Or Actions Taken?: Orders Received - See Orders for details

## 2017-06-22 ENCOUNTER — Inpatient Hospital Stay: Payer: BLUE CROSS/BLUE SHIELD

## 2017-06-22 ENCOUNTER — Encounter: Payer: Self-pay | Admitting: Emergency Medicine

## 2017-06-22 ENCOUNTER — Inpatient Hospital Stay
Admission: EM | Admit: 2017-06-22 | Discharge: 2017-06-24 | DRG: 872 | Disposition: A | Discharge status: Home / Self Care | Payer: BLUE CROSS/BLUE SHIELD | Attending: Internal Medicine | Admitting: Internal Medicine

## 2017-06-22 ENCOUNTER — Other Ambulatory Visit: Payer: Self-pay

## 2017-06-22 DIAGNOSIS — Z7982 Long term (current) use of aspirin: Secondary | ICD-10-CM

## 2017-06-22 DIAGNOSIS — Z8349 Family history of other endocrine, nutritional and metabolic diseases: Secondary | ICD-10-CM | POA: Diagnosis not present

## 2017-06-22 DIAGNOSIS — I10 Essential (primary) hypertension: Secondary | ICD-10-CM | POA: Diagnosis present

## 2017-06-22 DIAGNOSIS — A419 Sepsis, unspecified organism: Secondary | ICD-10-CM

## 2017-06-22 DIAGNOSIS — R7881 Bacteremia: Secondary | ICD-10-CM | POA: Diagnosis present

## 2017-06-22 DIAGNOSIS — R42 Dizziness and giddiness: Secondary | ICD-10-CM | POA: Diagnosis present

## 2017-06-22 DIAGNOSIS — Z803 Family history of malignant neoplasm of breast: Secondary | ICD-10-CM

## 2017-06-22 DIAGNOSIS — N401 Enlarged prostate with lower urinary tract symptoms: Secondary | ICD-10-CM | POA: Diagnosis present

## 2017-06-22 DIAGNOSIS — R748 Abnormal levels of other serum enzymes: Secondary | ICD-10-CM | POA: Diagnosis present

## 2017-06-22 DIAGNOSIS — N3001 Acute cystitis with hematuria: Secondary | ICD-10-CM | POA: Diagnosis present

## 2017-06-22 DIAGNOSIS — Z8249 Family history of ischemic heart disease and other diseases of the circulatory system: Secondary | ICD-10-CM | POA: Diagnosis not present

## 2017-06-22 DIAGNOSIS — Z823 Family history of stroke: Secondary | ICD-10-CM | POA: Diagnosis not present

## 2017-06-22 DIAGNOSIS — A4159 Other Gram-negative sepsis: Secondary | ICD-10-CM | POA: Diagnosis present

## 2017-06-22 DIAGNOSIS — Z79899 Other long term (current) drug therapy: Secondary | ICD-10-CM | POA: Diagnosis not present

## 2017-06-22 DIAGNOSIS — R338 Other retention of urine: Secondary | ICD-10-CM | POA: Diagnosis present

## 2017-06-22 DIAGNOSIS — E876 Hypokalemia: Secondary | ICD-10-CM | POA: Diagnosis present

## 2017-06-22 DIAGNOSIS — Z833 Family history of diabetes mellitus: Secondary | ICD-10-CM

## 2017-06-22 HISTORY — DX: Sepsis, unspecified organism: A41.9

## 2017-06-22 LAB — URINALYSIS, COMPLETE (UACMP) WITH MICROSCOPIC
Bilirubin Urine: NEGATIVE
Glucose, UA: NEGATIVE mg/dL
Ketones, ur: NEGATIVE mg/dL
Nitrite: NEGATIVE
PROTEIN: NEGATIVE mg/dL
RBC / HPF: >50 RBC/hpf — ABNORMAL HIGH (ref 0–5)
SPECIFIC GRAVITY, URINE: 1.008 (ref 1.005–1.030)
pH: 7 (ref 5.0–8.0)

## 2017-06-22 LAB — LACTIC ACID, PLASMA
LACTIC ACID, VENOUS: 1.4 mmol/L (ref 0.5–1.9)
Lactic Acid, Venous: 1.3 mmol/L (ref 0.5–1.9)

## 2017-06-22 LAB — BLOOD CULTURE ID PANEL (REFLEXED)
Acinetobacter baumannii: NOT DETECTED
CANDIDA PARAPSILOSIS: NOT DETECTED
CARBAPENEM RESISTANCE: NOT DETECTED
Candida albicans: NOT DETECTED
Candida glabrata: NOT DETECTED
Candida krusei: NOT DETECTED
Candida tropicalis: NOT DETECTED
ENTEROBACTERIACEAE SPECIES: DETECTED — AB
ENTEROCOCCUS SPECIES: NOT DETECTED
Enterobacter cloacae complex: NOT DETECTED
Escherichia coli: NOT DETECTED
HAEMOPHILUS INFLUENZAE: NOT DETECTED
KLEBSIELLA PNEUMONIAE: NOT DETECTED
Klebsiella oxytoca: DETECTED — AB
Listeria monocytogenes: NOT DETECTED
METHICILLIN RESISTANCE: NOT DETECTED
NEISSERIA MENINGITIDIS: NOT DETECTED
PSEUDOMONAS AERUGINOSA: NOT DETECTED
Proteus species: NOT DETECTED
STAPHYLOCOCCUS AUREUS BCID: NOT DETECTED
STREPTOCOCCUS PNEUMONIAE: NOT DETECTED
Serratia marcescens: NOT DETECTED
Staphylococcus species: DETECTED — AB
Streptococcus agalactiae: NOT DETECTED
Streptococcus pyogenes: NOT DETECTED
Streptococcus species: NOT DETECTED

## 2017-06-22 LAB — URINE CULTURE: CULTURE: NO GROWTH

## 2017-06-22 LAB — CBC WITH DIFFERENTIAL/PLATELET
BASOS PCT: 0 %
Basophils Absolute: 0.1 10*3/uL (ref 0–0.1)
Eosinophils Absolute: 0.3 10*3/uL (ref 0–0.7)
Eosinophils Relative: 2 %
HEMATOCRIT: 35.8 % — AB (ref 40.0–52.0)
Hemoglobin: 12.2 g/dL — ABNORMAL LOW (ref 13.0–18.0)
Lymphocytes Relative: 7 %
Lymphs Abs: 1 10*3/uL (ref 1.0–3.6)
MCH: 29.5 pg (ref 26.0–34.0)
MCHC: 34.2 g/dL (ref 32.0–36.0)
MCV: 86.1 fL (ref 80.0–100.0)
MONO ABS: 0.9 10*3/uL (ref 0.2–1.0)
MONOS PCT: 6 %
NEUTROS ABS: 12.1 10*3/uL — AB (ref 1.4–6.5)
Neutrophils Relative %: 85 %
Platelets: 216 10*3/uL (ref 150–440)
RBC: 4.15 MIL/uL — ABNORMAL LOW (ref 4.40–5.90)
RDW: 13.6 % (ref 11.5–14.5)
WBC: 14.4 10*3/uL — ABNORMAL HIGH (ref 3.8–10.6)

## 2017-06-22 LAB — BASIC METABOLIC PANEL
Anion gap: 7 (ref 5–15)
BUN: 17 mg/dL (ref 6–20)
CALCIUM: 8.2 mg/dL — AB (ref 8.9–10.3)
CO2: 24 mmol/L (ref 22–32)
Chloride: 100 mmol/L — ABNORMAL LOW (ref 101–111)
Creatinine, Ser: 1.09 mg/dL (ref 0.61–1.24)
GFR calc non Af Amer: >60 mL/min (ref >60)
Glucose, Bld: 123 mg/dL — ABNORMAL HIGH (ref 65–99)
Potassium: 3.3 mmol/L — ABNORMAL LOW (ref 3.5–5.1)
Sodium: 131 mmol/L — ABNORMAL LOW (ref 135–145)

## 2017-06-22 LAB — HEPATIC FUNCTION PANEL
ALBUMIN: 3.2 g/dL — AB (ref 3.5–5.0)
ALT: 43 U/L (ref 17–63)
AST: 48 U/L — ABNORMAL HIGH (ref 15–41)
Alkaline Phosphatase: 58 U/L (ref 38–126)
Bilirubin, Direct: <0.1 mg/dL — ABNORMAL LOW (ref 0.1–0.5)
TOTAL PROTEIN: 6.1 g/dL — AB (ref 6.5–8.1)
Total Bilirubin: 0.5 mg/dL (ref 0.3–1.2)

## 2017-06-22 LAB — TROPONIN I: TROPONIN I: 0.07 ng/mL — AB (ref <0.03)

## 2017-06-22 MED ORDER — ASPIRIN EC 81 MG PO TBEC
81.0000 mg | DELAYED_RELEASE_TABLET | Freq: Every day | ORAL | Status: DC
  Administered 2017-06-22 – 2017-06-24 (×3): 81 mg via ORAL
  Filled 2017-06-22 (×3): qty 1

## 2017-06-22 MED ORDER — LIDOCAINE 4 % EX CREA
TOPICAL_CREAM | Freq: Two times a day (BID) | CUTANEOUS | Status: DC | PRN
  Filled 2017-06-22: qty 5

## 2017-06-22 MED ORDER — CARBAMIDE PEROXIDE 6.5 % OT SOLN
5.0000 [drp] | Freq: Two times a day (BID) | OTIC | Status: AC
  Administered 2017-06-22 – 2017-06-23 (×2): 5 [drp] via OTIC
  Filled 2017-06-22 (×2): qty 15

## 2017-06-22 MED ORDER — POTASSIUM CHLORIDE CRYS ER 20 MEQ PO TBCR
40.0000 meq | EXTENDED_RELEASE_TABLET | Freq: Once | ORAL | Status: AC
Start: 2017-06-22 — End: 2017-06-22
  Administered 2017-06-22: 40 meq via ORAL
  Filled 2017-06-22: qty 2

## 2017-06-22 MED ORDER — OMEGA-3-ACID ETHYL ESTERS 1 G PO CAPS
1.0000 g | ORAL_CAPSULE | Freq: Every day | ORAL | Status: DC
  Administered 2017-06-23 – 2017-06-24 (×2): 1 g via ORAL
  Filled 2017-06-22 (×3): qty 1

## 2017-06-22 MED ORDER — SODIUM CHLORIDE 0.9 % IV BOLUS
1000.0000 mL | Freq: Once | INTRAVENOUS | Status: AC
  Administered 2017-06-22: 1000 mL via INTRAVENOUS

## 2017-06-22 MED ORDER — SODIUM CHLORIDE 0.9 % IV SOLN
2.0000 g | Freq: Three times a day (TID) | INTRAVENOUS | Status: DC
  Administered 2017-06-22 – 2017-06-23 (×4): 2 g via INTRAVENOUS
  Filled 2017-06-22 (×5): qty 2

## 2017-06-22 MED ORDER — OXYCODONE-ACETAMINOPHEN 5-325 MG PO TABS
1.0000 | ORAL_TABLET | Freq: Four times a day (QID) | ORAL | Status: DC | PRN
  Administered 2017-06-22 – 2017-06-24 (×3): 1 via ORAL
  Filled 2017-06-22 (×3): qty 1

## 2017-06-22 MED ORDER — VANCOMYCIN HCL IN DEXTROSE 1-5 GM/200ML-% IV SOLN
1000.0000 mg | INTRAVENOUS | Status: AC
  Administered 2017-06-22: 1000 mg via INTRAVENOUS
  Filled 2017-06-22: qty 200

## 2017-06-22 MED ORDER — HEPARIN SODIUM (PORCINE) 5000 UNIT/ML IJ SOLN
5000.0000 [IU] | Freq: Three times a day (TID) | INTRAMUSCULAR | Status: DC
  Administered 2017-06-22 – 2017-06-23 (×3): 5000 [IU] via SUBCUTANEOUS
  Filled 2017-06-22 (×3): qty 1

## 2017-06-22 MED ORDER — SODIUM CHLORIDE 0.9 % IV SOLN
INTRAVENOUS | Status: DC
  Administered 2017-06-22: 12:00:00 via INTRAVENOUS

## 2017-06-22 MED ORDER — DOCUSATE SODIUM 100 MG PO CAPS: 100.0000 mg | ORAL_CAPSULE | Freq: Two times a day (BID) | ORAL | Status: DC | PRN

## 2017-06-22 MED ORDER — CEFTRIAXONE SODIUM 2 G IJ SOLR
2.0000 g | Freq: Once | INTRAMUSCULAR | Status: AC
  Administered 2017-06-22: 2 g via INTRAVENOUS
  Filled 2017-06-22: qty 20

## 2017-06-22 MED ORDER — SODIUM CHLORIDE 0.9 % IV SOLN
1250.0000 mg | Freq: Two times a day (BID) | INTRAVENOUS | Status: DC
  Administered 2017-06-22 – 2017-06-23 (×2): 1250 mg via INTRAVENOUS
  Filled 2017-06-22 (×3): qty 1250

## 2017-06-22 MED ORDER — GADOBENATE DIMEGLUMINE 529 MG/ML IV SOLN
15.0000 mL | Freq: Once | INTRAVENOUS | Status: AC | PRN
  Administered 2017-06-22: 15 mL via INTRAVENOUS

## 2017-06-22 MED ORDER — TAMSULOSIN HCL 0.4 MG PO CAPS
0.4000 mg | ORAL_CAPSULE | Freq: Every day | ORAL | Status: DC
  Administered 2017-06-22 – 2017-06-24 (×3): 0.4 mg via ORAL
  Filled 2017-06-22 (×3): qty 1

## 2017-06-22 MED ORDER — ACETAMINOPHEN 325 MG PO TABS: 650.0000 mg | ORAL_TABLET | Freq: Four times a day (QID) | ORAL | Status: DC | PRN

## 2017-06-22 MED ORDER — ADULT MULTIVITAMIN W/MINERALS CH
1.0000 | ORAL_TABLET | Freq: Every day | ORAL | Status: DC
  Administered 2017-06-23 – 2017-06-24 (×2): 1 via ORAL
  Filled 2017-06-22 (×5): qty 1

## 2017-06-22 NOTE — Consult Note (Signed)
[facility] Infectious Disease     Reason for Consult: Klebsiella bacteremia    Referring Physician: Dolores Frame Date of Admission:  06/22/2017   Active Problems:   Sepsis [facility])  HPI: Willie Good is a 57 y.o. male admitted with sepsis and Klebsiella bacteremia. He is relatively healthy at baseline. Reports some dysuria for 2 days prior. Sunday slept all day, felt weak. Monday still felt ill and had rigors and fever. Also some confusion. On admit wbc 12, temp 100.8.  CT with mild intrahepatic dilation and periportal edema. LFTs nml Had to have foley placed with large amt urine.  Past Medical History:  Diagnosis Date  . Allergy   . Essential hypertension    Past Surgical History:  Procedure Laterality Date  . APPENDECTOMY     Social History   Tobacco Use  . Smoking status: Never Smoker  . Smokeless tobacco: Never Used  Substance Use Topics  . Alcohol use: Yes    Alcohol/week: 0.6 oz    Types: 1 Cans of beer per week  . Drug use: No   Family History  Problem Relation Age of Onset  . Breast cancer Mother   . Diabetes Father   . Heart disease Father   . Hyperlipidemia Father   . Hypertension Father   . Diabetes Sister   . Cancer Maternal Grandmother   . Cancer Maternal Grandfather   . Cancer Paternal Grandmother   . Diabetes Paternal Grandfather   . Heart disease Paternal Grandfather   . Hyperlipidemia Paternal Grandfather   . Hypertension Paternal Grandfather   . Stroke Paternal Grandfather     Allergies: No Known Allergies  Current antibiotics: Antibiotics Given (last 72 hours)    Date/Time Action Medication Dose Rate   06/22/17 0950 New Bag/Given   cefTRIAXone (ROCEPHIN) 2 g in sodium chloride 0.9 % 100 mL IVPB 2 g 200 mL/hr   06/22/17 1022 New Bag/Given   vancomycin (VANCOCIN) IVPB 1000 mg/200 mL premix 1,000 mg 200 mL/hr   06/22/17 1143 New Bag/Given   ceFEPIme (MAXIPIME) 2 g in sodium chloride 0.9 % 100 mL IVPB 2 g 200 mL/hr      MEDICATIONS: .  aspirin EC  81 mg Oral Daily  . heparin  5,000 Units Subcutaneous Q8H  . multivitamin with minerals  1 tablet Oral Daily  . omega-3 acid ethyl esters  1 g Oral Daily  . tamsulosin  0.4 mg Oral Daily    Review of Systems - 11 systems reviewed and negative per HPI   OBJECTIVE: Temp:  [98.9 F (37.2 C)-99.5 F (37.5 C)] 98.9 F (37.2 C) (05/01 1051) Pulse Rate:  [66-110] 68 (05/01 1051) Resp:  [11-20] 16 (05/01 1051) BP: (111-150)/(65-95) 137/84 (05/01 1051) SpO2:  [95 %-100 %] 100 % (05/01 1051) Weight:  [83.9 kg (185 lb)-84.2 kg (185 lb 9.6 oz)] 84.2 kg (185 lb 9.6 oz) (05/01 1051) Physical Exam  Constitutional: He is oriented to person, place, and time. He appears well-developed and well-nourished. No distress.  HENT:  Mouth/Throat: Oropharynx is clear and moist. No oropharyngeal exudate.  Cardiovascular: Normal rate, regular rhythm and normal heart sounds. Exam reveals no gallop and no friction rub.  No murmur heard.  Pulmonary/Chest: Effort normal and breath sounds normal. No respiratory distress. He has no wheezes.  Abdominal: Soft. Bowel sounds are normal.  Mild ttp RUQ, and pelvic regions Lymphadenopathy:  He has no cervical adenopathy.  Neurological: He is alert and oriented to person, place, and time.  Skin:  Skin is warm and dry. No rash noted. No erythema.  Psychiatric: He has a normal mood and affect. His behavior is normal.     LABS: Results for orders placed or performed during the hospital encounter of 06/22/17 (from the past 48 hour(s))  CBC with Differential     Status: Abnormal   Collection Time: 06/22/17  9:07 AM  Result Value Ref Range   WBC 14.4 (H) 3.8 - 10.6 K/uL   RBC 4.15 (L) 4.40 - 5.90 MIL/uL   Hemoglobin 12.2 (L) 13.0 - 18.0 g/dL   HCT 35.8 (L) 40.0 - 52.0 %   MCV 86.1 80.0 - 100.0 fL   MCH 29.5 26.0 - 34.0 pg   MCHC 34.2 32.0 - 36.0 g/dL   RDW 13.6 11.5 - 14.5 %   Platelets 216 150 - 440 K/uL   Neutrophils Relative % 85 %   Neutro Abs 12.1  (H) 1.4 - 6.5 K/uL   Lymphocytes Relative 7 %   Lymphs Abs 1.0 1.0 - 3.6 K/uL   Monocytes Relative 6 %   Monocytes Absolute 0.9 0.2 - 1.0 K/uL   Eosinophils Relative 2 %   Eosinophils Absolute 0.3 0 - 0.7 K/uL   Basophils Relative 0 %   Basophils Absolute 0.1 0 - 0.1 K/uL    Comment: Performed at [facility], [address] [place], [place] [address]  Basic metabolic panel     Status: Abnormal   Collection Time: 06/22/17  9:07 AM  Result Value Ref Range   Sodium 131 (L) 135 - 145 mmol/L   Potassium 3.3 (L) 3.5 - 5.1 mmol/L   Chloride 100 (L) 101 - 111 mmol/L   CO2 24 22 - 32 mmol/L   Glucose, Bld 123 (H) 65 - 99 mg/dL   BUN 17 6 - 20 mg/dL   Creatinine, Ser 1.09 0.61 - 1.24 mg/dL   Calcium 8.2 (L) 8.9 - 10.3 mg/dL   GFR calc non Af Amer >60 >60 mL/min   GFR calc Af Amer >60 >60 mL/min    Comment: (NOTE) The eGFR has been calculated using the CKD EPI equation. This calculation has not been validated in all clinical situations. eGFR's persistently <60 mL/min signify possible Chronic Kidney Disease.    Anion gap 7 5 - 15    Comment: Performed at [facility], [address] [place], [place] [address]  Urinalysis, Complete w Microscopic     Status: Abnormal   Collection Time: 06/22/17  9:07 AM  Result Value Ref Range   Color, Urine YELLOW (A) YELLOW   APPearance CLEAR (A) CLEAR   Specific Gravity, Urine 1.008 1.005 - 1.030   pH 7.0 5.0 - 8.0   Glucose, UA NEGATIVE NEGATIVE mg/dL   Hgb urine dipstick LARGE (A) NEGATIVE   Bilirubin Urine NEGATIVE NEGATIVE   Ketones, ur NEGATIVE NEGATIVE mg/dL   Protein, ur NEGATIVE NEGATIVE mg/dL   Nitrite NEGATIVE NEGATIVE   Leukocytes, UA MODERATE (A) NEGATIVE   RBC / HPF >50 (H) 0 - 5 RBC/hpf   WBC, UA 11-20 0 - 5 WBC/hpf   Bacteria, UA RARE (A) NONE SEEN   Squamous Epithelial / LPF 0-5 0 - 5    Comment: Please note change in reference range.   Mucus PRESENT     Comment: Performed at [facility], [address] [place], [place] [address]  Troponin I     Status: Abnormal   Collection Time: 06/22/17  9:07 AM  Result Value  Ref Range   Troponin I 0.07 (HH) <0.03 ng/mL    Comment: CRITICAL RESULT CALLED TO, READ BACK BY AND VERIFIED WITH ANNA HOLT '@[id number]'  06/22/17 [facility] Performed at [facility], [address] [place], [place] [address]   Lactic acid, plasma     Status: None   Collection Time: 06/22/17  9:11 AM  Result Value Ref Range   Lactic Acid, Venous 1.4 0.5 - 1.9 mmol/L    Comment: Performed at [facility], [address] [place], [place] [address]  Lactic acid, plasma     Status: None   Collection Time: 06/22/17 11:56 AM  Result Value Ref Range   Lactic Acid, Venous 1.3 0.5 - 1.9 mmol/L    Comment: Performed at [facility], [address] [place], [place] [address]   No components found for: ESR, C REACTIVE PROTEIN MICRO: Recent Results (from the past 720 hour(s))  Blood Culture (routine x 2)     Status: None (Preliminary result)   Collection Time: 06/21/17  2:36 PM  Result Value Ref Range Status   Specimen Description   Final    BLOOD BLOOD LEFT HAND Performed at [facility], [address], [place], [place] [address]    Special Requests   Final    BOTTLES DRAWN AEROBIC AND ANAEROBIC Blood Culture results may not be optimal due to an excessive volume of blood received in culture bottles Performed at [facility], [address] [place], [place] [address]    Culture  Setup Time   Final    IN BOTH AEROBIC AND ANAEROBIC BOTTLES GRAM NEGATIVE RODS CRITICAL RESULT CALLED TO, READ BACK BY AND VERIFIED WITH: KAREN HAYES AT [place] ON 06/22/17 [place]. Performed at [facility], [place] [address] [place], [place] [address]    Culture GRAM NEGATIVE RODS  Final   Report Status PENDING  Incomplete  Blood Culture (routine x 2)     Status: None (Preliminary result)   Collection Time: 06/21/17  2:36 PM  Result Value Ref Range  Status   Specimen Description BLOOD RIGHT ANTECUBITAL  Final   Special Requests   Final    BOTTLES DRAWN AEROBIC AND ANAEROBIC Blood Culture adequate volume   Culture  Setup Time   Final    AEROBIC BOTTLE ONLY GRAM NEGATIVE RODS CRITICAL VALUE NOTED.  VALUE IS CONSISTENT WITH PREVIOUSLY REPORTED AND CALLED VALUE. Performed at [facility], [address] [place], [place] [address]    Culture PENDING  Incomplete   Report Status PENDING  Incomplete  Blood Culture ID Panel (Reflexed)     Status: Abnormal   Collection Time: 06/21/17  2:36 PM  Result Value Ref Range Status   Enterococcus species NOT DETECTED NOT DETECTED Final   Listeria monocytogenes NOT DETECTED NOT DETECTED Final   Staphylococcus species DETECTED (A) NOT DETECTED Final    Comment: Methicillin (oxacillin) susceptible coagulase negative staphylococcus. Possible blood culture contaminant (unless isolated from more than one blood culture draw or clinical case suggests pathogenicity). No antibiotic treatment is indicated for blood  culture contaminants. CRITICAL RESULT CALLED TO, READ BACK BY AND VERIFIED WITH: KAREN HAYES AT [place] ON 06/22/17 [place].    Staphylococcus aureus NOT DETECTED NOT DETECTED Final   Methicillin resistance NOT DETECTED NOT DETECTED Final   Streptococcus species NOT DETECTED NOT DETECTED Final   Streptococcus agalactiae NOT DETECTED NOT DETECTED Final   Streptococcus pneumoniae NOT DETECTED NOT DETECTED Final   Streptococcus pyogenes NOT DETECTED NOT  DETECTED Final   Acinetobacter baumannii NOT DETECTED NOT DETECTED Final   Enterobacteriaceae species DETECTED (A) NOT DETECTED Final    Comment: Enterobacteriaceae represent a large family of gram-negative bacteria, not a single organism. CRITICAL RESULT CALLED TO, READ BACK BY AND VERIFIED WITH: KAREN HAYES AT [place] ON 06/22/17 [place].    Enterobacter cloacae complex NOT DETECTED NOT DETECTED Final   Escherichia coli NOT DETECTED NOT DETECTED Final    Klebsiella oxytoca DETECTED (A) NOT DETECTED Final    Comment: CRITICAL RESULT CALLED TO, READ BACK BY AND VERIFIED WITH: KAREN HAYES AT 0721 ON 06/22/17 [place].    Klebsiella pneumoniae NOT DETECTED NOT DETECTED Final   Proteus species NOT DETECTED NOT DETECTED Final   Serratia marcescens NOT DETECTED NOT DETECTED Final   Carbapenem resistance NOT DETECTED NOT DETECTED Final   Haemophilus influenzae NOT DETECTED NOT DETECTED Final   Neisseria meningitidis NOT DETECTED NOT DETECTED Final   Pseudomonas aeruginosa NOT DETECTED NOT DETECTED Final   Candida albicans NOT DETECTED NOT DETECTED Final   Candida glabrata NOT DETECTED NOT DETECTED Final   Candida krusei NOT DETECTED NOT DETECTED Final   Candida parapsilosis NOT DETECTED NOT DETECTED Final   Candida tropicalis NOT DETECTED NOT DETECTED Final    Comment: Performed at [facility], [address] [place], [place] [address]  Urine culture     Status: None   Collection Time: 06/21/17  5:21 PM  Result Value Ref Range Status   Specimen Description   Final    URINE, RANDOM Performed at [facility], [address], [place], [place] [address]    Special Requests   Final    NONE Performed at [facility], [address], [place], [place] [address]    Culture   Final    NO GROWTH Performed at [facility], [place] [address], [place], [place] [address]    Report Status 06/22/2017 FINAL  Final    IMAGING: Ct Head Wo Contrast  Result Date: 06/21/2017 CLINICAL DATA:  Lightheaded and dizziness.  Fell today.  Fever. EXAM: CT HEAD WITHOUT CONTRAST TECHNIQUE: Contiguous axial images were obtained from the base of the skull through the vertex without intravenous contrast. COMPARISON:  Brain MR dated 02/03/2014 and head CT dated 02/03/2014. FINDINGS: Brain: Normal appearing cerebral hemispheres and posterior fossa structures. Normal size and position of the ventricles. No intracranial hemorrhage, mass lesion or  CT evidence of acute infarction. Vascular: No hyperdense vessel or unexpected calcification. Skull: Normal. Negative for fracture or focal lesion. Sinuses/Orbits: Unremarkable. Other: None. IMPRESSION: Normal examination. Electronically Signed   By: Claudie Revering M.D.   On: 06/21/2017 19:32   Ct Abdomen Pelvis W Contrast  Result Date: 06/21/2017 CLINICAL DATA:  Pain and burning with urination for the past 3 days. Fever. Fell today. EXAM: CT ABDOMEN AND PELVIS WITH CONTRAST TECHNIQUE: Multidetector CT imaging of the abdomen and pelvis was performed using the standard protocol following bolus administration of intravenous contrast. CONTRAST:  72m ISOVUE-370 IOPAMIDOL (ISOVUE-370) INJECTION 76% COMPARISON:  None. FINDINGS: Lower chest: Minimal left lower lobe dependent atelectasis. Hepatobiliary: Diffuse periportal edema, most pronounced in the lateral segment of the left lower lobe, with mild intrahepatic biliary ductal dilatation, more pronounced in the lateral segment of the left lobe. No extrahepatic biliary dilatation seen. Normal appearing gallbladder. Pancreas: Unremarkable. No pancreatic ductal dilatation or surrounding inflammatory changes. Spleen: Multiple small calcified granulomata. Adrenals/Urinary Tract: Normal appearing adrenal glands. Two tiny right renal cysts. 2.1 cm left renal  cyst containing a small amount of coarse curvilinear peripheral calcification. Small exophytic lower pole left renal cyst with mild adjacent edema. Tiny lower pole left renal cyst. Mild diffuse bladder wall thickening. Normal appearing ureters. No ureteral calculi or hydronephrosis seen. Stomach/Bowel: Prominent stool in the colon. Surgically absent appendix. Unremarkable small bowel and stomach. Vascular/Lymphatic: No significant vascular findings are present. No enlarged abdominal or pelvic lymph nodes. Reproductive: Moderately enlarged and mildly heterogeneous prostate gland with protrusion of the median lobe into the  base of the urinary bladder. Other: Small bilateral inguinal hernias containing fat. Musculoskeletal: Lumbar and lower thoracic spine degenerative changes. IMPRESSION: 1. Mild intrahepatic biliary ductal dilatation with diffuse periportal edema, most pronounced in the lateral segment of the left lobe. These changes could be due to a centrally obstructing biliary mass and secondary cholangitis or due to hepatitis. 2. Tiny, nonobstructing lower pole left renal calculus. 3. Mild diffuse bladder wall thickening, most likely due to chronic bladder outlet obstruction by the enlarged prostate gland. Cystitis can also produce this appearance. 4. Moderate prostatic hypertrophy. Electronically Signed   By: Claudie Revering M.D.   On: 06/21/2017 19:28   Dg Chest Port 1 View  Result Date: 06/21/2017 CLINICAL DATA:  57 y/o  M; fever. EXAM: PORTABLE CHEST 1 VIEW COMPARISON:  None. FINDINGS: The heart size and mediastinal contours are within normal limits. Both lungs are clear. The visualized skeletal structures are unremarkable. IMPRESSION: No active disease. Electronically Signed   By: Kristine Garbe M.D.   On: 06/21/2017 15:03    Assessment:   HAYTHAM MAHER is a 57 y.o. male with hx possible BPH and prior hx chronic prostatitis admitted with + [place] oxytoca and coag neg staph. Originally had some dysuria, diff with urine stream and then rigors and confusion. Had to have foley placed in ED due to urinary retention, UA with 21-50 wbc, ucx pending. CT showed biliary dilatation and periportal edema. Does have some RUQ abd pain. LFTs initially nml. Likely has UTI due to urinary retention but some concern for biliary process with RUQ pain and abnml CT abd.  Recommendations Cont cefepime. Cont vanco for now but if coag neg staph can dc.  Check MRCP. If febrile add anaerobic coverage. Thank you very much for allowing me to participate in the care of this patient. Please call with questions.   Cheral Marker.  Ola Spurr, MD

## 2017-06-22 NOTE — ED Notes (Signed)
First Nurse Note:  Patient states he received call this AM and was told to come immediately to the hospital.  Charge Nurse called - patient to go to room 19.

## 2017-06-22 NOTE — Progress Notes (Signed)
Pharmacy Antibiotic Note  Willie Good is a 57 y.o. male admitted on 06/22/2017 with bacteremia/?UTI.  Pharmacy has been consulted for Cefepime and Vancomycin dosing.  BCID: +GNR, Staph species, Klebsiella oxytoca from ER visit 06/21/17  Plan: Patient received Ceftriaxone 2 gram IV x 1 and Vancomycin 1 gram IV x 1 in ER. Will continue with Cefepime 2 gram IV q8h. Will give stacked dosing of Vancomycin 1250mg  IV q12h. Ke 0.067  T1/2 10.35  Vd 58.7   Will order Vancomycin trough prior to 5th dose on 06/24/17.    Height: 6\' 1"  (185.4 cm) Weight: 185 lb 9.6 oz (84.2 kg) IBW/kg (Calculated) : 79.9  Temp (24hrs), Avg:99.7 F (37.6 C), Min:98.9 F (37.2 C), Max:100.8 F (38.2 C)  Recent Labs  Lab 06/21/17 1336 06/21/17 1436 06/21/17 1555 06/22/17 0907 06/22/17 0911  WBC 12.0*  --   --  14.4*  --   CREATININE  --   --  1.22 1.09  --   LATICACIDVEN  --  1.6  --   --  1.4    Estimated Creatinine Clearance: 84.5 mL/min (by C-G formula based on SCr of 1.09 mg/dL).    No Known Allergies  Antimicrobials this admission: CTX x 1 51 >>   Cefepime  5/1 >>   VANC 5/1 >>  Dose adjustments this admission:    Microbiology results: 5/1 BCx: PENDING (BCID: +GNR, Staph species, Klebsiella oxytoca from ER visit 06/21/17) 5/1 UCx: PENDING    Sputum:      MRSA PCR:    Thank you for allowing pharmacy to be a part of this patient's care.  Rajanae Mantia A 06/22/2017 12:34 PM

## 2017-06-22 NOTE — Progress Notes (Signed)
ED Antimicrobial Stewardship Positive Culture Follow Up   Willie Good is an 57 y.o. male who presented to [facility] on 06/21/2017 with a chief complaint of  Chief Complaint  Patient presents with  . Urinary Tract Infection    Recent Results (from the past 720 hour(s))  Blood Culture (routine x 2)     Status: None (Preliminary result)   Collection Time: 06/21/17  2:36 PM  Result Value Ref Range Status   Specimen Description BLOOD BLOOD LEFT HAND  Final   Special Requests   Final    BOTTLES DRAWN AEROBIC AND ANAEROBIC Blood Culture results may not be optimal due to an excessive volume of blood received in culture bottles   Culture  Setup Time   Final    Organism ID to follow IN BOTH AEROBIC AND ANAEROBIC BOTTLES GRAM NEGATIVE RODS CRITICAL RESULT CALLED TO, READ BACK BY AND VERIFIED WITH: KAREN HAYES AT [place] ON 06/22/17 [place]. Performed at [facility], [address] [place], [place] [address]    Culture GRAM NEGATIVE RODS  Final   Report Status PENDING  Incomplete  Blood Culture ID Panel (Reflexed)     Status: Abnormal   Collection Time: 06/21/17  2:36 PM  Result Value Ref Range Status   Enterococcus species NOT DETECTED NOT DETECTED Final   Listeria monocytogenes NOT DETECTED NOT DETECTED Final   Staphylococcus species DETECTED (A) NOT DETECTED Final    Comment: Methicillin (oxacillin) susceptible coagulase negative staphylococcus. Possible blood culture contaminant (unless isolated from more than one blood culture draw or clinical case suggests pathogenicity). No antibiotic treatment is indicated for blood  culture contaminants. CRITICAL RESULT CALLED TO, READ BACK BY AND VERIFIED WITH: KAREN HAYES AT [place] ON 06/22/17 [place].    Staphylococcus aureus NOT DETECTED NOT DETECTED Final   Methicillin resistance NOT DETECTED NOT DETECTED Final   Streptococcus species NOT DETECTED NOT DETECTED Final   Streptococcus agalactiae NOT DETECTED NOT DETECTED Final   Streptococcus  pneumoniae NOT DETECTED NOT DETECTED Final   Streptococcus pyogenes NOT DETECTED NOT DETECTED Final   Acinetobacter baumannii NOT DETECTED NOT DETECTED Final   Enterobacteriaceae species DETECTED (A) NOT DETECTED Final    Comment: Enterobacteriaceae represent a large family of gram-negative bacteria, not a single organism. CRITICAL RESULT CALLED TO, READ BACK BY AND VERIFIED WITH: KAREN HAYES AT [place] ON 06/22/17 [place].    Enterobacter cloacae complex NOT DETECTED NOT DETECTED Final   Escherichia coli NOT DETECTED NOT DETECTED Final   Klebsiella oxytoca DETECTED (A) NOT DETECTED Final    Comment: CRITICAL RESULT CALLED TO, READ BACK BY AND VERIFIED WITH: KAREN HAYES AT 0721 ON 06/22/17 [place].    Klebsiella pneumoniae NOT DETECTED NOT DETECTED Final   Proteus species NOT DETECTED NOT DETECTED Final   Serratia marcescens NOT DETECTED NOT DETECTED Final   Carbapenem resistance NOT DETECTED NOT DETECTED Final   Haemophilus influenzae NOT DETECTED NOT DETECTED Final   Neisseria meningitidis NOT DETECTED NOT DETECTED Final   Pseudomonas aeruginosa NOT DETECTED NOT DETECTED Final   Candida albicans NOT DETECTED NOT DETECTED Final   Candida glabrata NOT DETECTED NOT DETECTED Final   Candida krusei NOT DETECTED NOT DETECTED Final   Candida parapsilosis NOT DETECTED NOT DETECTED Final   Candida tropicalis NOT DETECTED NOT DETECTED Final    Comment: Performed at [facility], [address] [place], [place] [address]    [x]  Treated with cefdinir. BCx with GNR, Staph species and Klebsiella oxytoca per BCID.  []   Patient discharged originally without antimicrobial agent and treatment is now indicated  New antibiotic prescription: Patient instructed to return to ER  ED Provider: Rush Barer D 06/22/2017, 8:02 AM Infectious Diseases Pharmacist Phone# [phone]

## 2017-06-22 NOTE — ED Triage Notes (Signed)
Pt presents to ED via POV, states he was called by Dr. Burlene Arnt this morning and told to return due to bacteria in his urine and results of his urine culture. Pt is alert and oriented on arrival. Pt taken to room 19 for triage.

## 2017-06-22 NOTE — ED Provider Notes (Signed)
-----------------------------------------   7:59 AM on 06/22/2017 -----------------------------------------  Made aware of this patient at this time. Has positive cultures. I immediately called pt. Pt feels okay, no high fevers, still feels unwell, no more chills since abx last night. Has not started home abx. I have strongly advised that he return to the ER for repeat cx and iv abx. He agrees and will come in. Charge nurse made aware.   Schuyler Amor, MD 06/22/17 [phone]

## 2017-06-22 NOTE — H&P (Signed)
[facility] [place] NAME: Willie Good    MR#:  [id number]  DATE OF BIRTH:  11-09-1960  DATE OF ADMISSION:  06/22/2017  PRIMARY CARE PHYSICIAN: Pleas Koch, NP   REQUESTING/REFERRING PHYSICIAN: McShane  CHIEF COMPLAINT:   Chief Complaint  Patient presents with  . Abnormal Lab    HISTORY OF PRESENT ILLNESS: Willie Good  is a 57 y.o. male with a known history of essential hypertension- came to emergency room with urinary symptoms and fever- sent home from ER with Keflex and Urinary catheter. His blood cx reported positive so he was called in to come back in ER today. Also have c/o both ear ache today. Pt was very confused and disoriented yesterday before coming to ER, but is alert and oriented now.  PAST MEDICAL HISTORY:   Past Medical History:  Diagnosis Date  . Allergy   . Essential hypertension     PAST SURGICAL HISTORY:  Past Surgical History:  Procedure Laterality Date  . APPENDECTOMY      SOCIAL HISTORY:  Social History   Tobacco Use  . Smoking status: Never Smoker  . Smokeless tobacco: Never Used  Substance Use Topics  . Alcohol use: Yes    Alcohol/week: 0.6 oz    Types: 1 Cans of beer per week    FAMILY HISTORY:  Family History  Problem Relation Age of Onset  . Breast cancer Mother   . Diabetes Father   . Heart disease Father   . Hyperlipidemia Father   . Hypertension Father   . Diabetes Sister   . Cancer Maternal Grandmother   . Cancer Maternal Grandfather   . Cancer Paternal Grandmother   . Diabetes Paternal Grandfather   . Heart disease Paternal Grandfather   . Hyperlipidemia Paternal Grandfather   . Hypertension Paternal Grandfather   . Stroke Paternal Grandfather     DRUG ALLERGIES: No Known Allergies  REVIEW OF SYSTEMS:   CONSTITUTIONAL: No fever, fatigue or weakness.  EYES: No blurred or double vision.  EARS, NOSE, AND THROAT: No tinnitus or ear pain.  RESPIRATORY: No cough,  shortness of breath, wheezing or hemoptysis.  CARDIOVASCULAR: No chest pain, orthopnea, edema.  GASTROINTESTINAL: No nausea, vomiting, diarrhea or abdominal pain.  GENITOURINARY: No dysuria, hematuria.  ENDOCRINE: No polyuria, nocturia,  HEMATOLOGY: No anemia, easy bruising or bleeding SKIN: No rash or lesion. MUSCULOSKELETAL: No joint pain or arthritis.   NEUROLOGIC: No tingling, numbness, weakness.  PSYCHIATRY: No anxiety or depression.   MEDICATIONS AT HOME:  Prior to Admission medications   Medication Sig Start Date End Date Taking? Authorizing Provider  acetaminophen (TYLENOL) 325 MG tablet Take 2 tablets (650 mg total) by mouth every 6 (six) hours as needed. 06/21/17  Yes Carrie Mew, MD  aspirin EC 81 MG tablet Take 1 tablet (81 mg total) by mouth daily. 06/21/17 08/20/17 Yes Carrie Mew, MD  cefdinir (OMNICEF) 300 MG capsule Take 1 capsule (300 mg total) by mouth 2 (two) times daily. 06/21/17  Yes Carrie Mew, MD  Multiple Vitamins-Minerals (MULTIVITAMIN WITH MINERALS) tablet Take 1 tablet by mouth daily.   Yes [provider]  Omega-3 Fatty Acids (FISH OIL PO) Take 2,000 mg by mouth daily.    Yes [provider]      PHYSICAL EXAMINATION:   VITAL SIGNS: Blood pressure 137/84, pulse 68, temperature 98.9 F (37.2 C), temperature source Oral, resp. rate 16, height 6\' 1"  (1.854 m), weight 84.2 kg (185 lb 9.6 oz),  SpO2 100 %.  GENERAL:  57 y.o.-year-old patient lying in the bed with no acute distress.  EYES: Pupils equal, round, reactive to light and accommodation. No scleral icterus. Extraocular muscles intact.  HEENT: Head atraumatic, normocephalic. Oropharynx and nasopharynx clear. No tenderness on and around ears. NECK:  Supple, no jugular venous distention. No thyroid enlargement, no tenderness.  LUNGS: Normal breath sounds bilaterally, no wheezing, rales,rhonchi or crepitation. No use of accessory muscles of respiration.  CARDIOVASCULAR: S1,  S2 normal. No murmurs, rubs, or gallops.  ABDOMEN: Soft, nontender, nondistended. Bowel sounds present. No organomegaly or mass. Foley catheter in place. EXTREMITIES: No pedal edema, cyanosis, or clubbing.  NEUROLOGIC: Cranial nerves II through XII are intact. Muscle strength 5/5 in all extremities. Sensation intact. Gait not checked.  PSYCHIATRIC: The patient is alert and oriented x 3.  SKIN: No obvious rash, lesion, or ulcer.   LABORATORY PANEL:   CBC Recent Labs  Lab 06/21/17 1336 06/22/17 0907  WBC 12.0* 14.4*  HGB 13.9 12.2*  HCT 40.2 35.8*  PLT 378 216  MCV 85.9 86.1  MCH 29.8 29.5  MCHC 34.7 34.2  RDW 13.7 13.6  LYMPHSABS 0.7* 1.0  MONOABS 0.4 0.9  EOSABS 0.0 0.3  BASOSABS 0.0 0.1   ------------------------------------------------------------------------------------------------------------------  Chemistries  Recent Labs  Lab 06/21/17 1555 06/22/17 0907  NA 136 131*  K 3.3* 3.3*  CL 103 100*  CO2 25 24  GLUCOSE 137* 123*  BUN 13 17  CREATININE 1.22 1.09  CALCIUM 8.5* 8.2*  AST 61* 48*  ALT 45 43  ALKPHOS 63 58  BILITOT 0.7 0.5   ------------------------------------------------------------------------------------------------------------------ estimated creatinine clearance is 84.5 mL/min (by C-G formula based on SCr of 1.09 mg/dL). ------------------------------------------------------------------------------------------------------------------ No results for input(s): TSH, T4TOTAL, T3FREE, THYROIDAB in the last 72 hours.  Invalid input(s): FREET3   Coagulation profile Recent Labs  Lab 06/21/17 1336  INR 1.13   ------------------------------------------------------------------------------------------------------------------- No results for input(s): DDIMER in the last 72 hours. -------------------------------------------------------------------------------------------------------------------  Cardiac Enzymes Recent Labs  Lab 06/21/17 1555  06/21/17 2010 06/22/17 0907  TROPONINI 0.31* 0.25* 0.07*   ------------------------------------------------------------------------------------------------------------------ Invalid input(s): POCBNP  ---------------------------------------------------------------------------------------------------------------  Urinalysis    Component Value Date/Time   COLORURINE YELLOW (A) 06/22/2017 0907   APPEARANCEUR CLEAR (A) 06/22/2017 0907   LABSPEC 1.008 06/22/2017 0907   PHURINE 7.0 06/22/2017 0907   GLUCOSEU NEGATIVE 06/22/2017 0907   HGBUR LARGE (A) 06/22/2017 0907   BILIRUBINUR NEGATIVE 06/22/2017 0907   KETONESUR NEGATIVE 06/22/2017 0907   PROTEINUR NEGATIVE 06/22/2017 0907   NITRITE NEGATIVE 06/22/2017 0907   LEUKOCYTESUR MODERATE (A) 06/22/2017 0907     RADIOLOGY: Ct Head Wo Contrast  Result Date: 06/21/2017 CLINICAL DATA:  Lightheaded and dizziness.  Fell today.  Fever. EXAM: CT HEAD WITHOUT CONTRAST TECHNIQUE: Contiguous axial images were obtained from the base of the skull through the vertex without intravenous contrast. COMPARISON:  Brain MR dated 02/03/2014 and head CT dated 02/03/2014. FINDINGS: Brain: Normal appearing cerebral hemispheres and posterior fossa structures. Normal size and position of the ventricles. No intracranial hemorrhage, mass lesion or CT evidence of acute infarction. Vascular: No hyperdense vessel or unexpected calcification. Skull: Normal. Negative for fracture or focal lesion. Sinuses/Orbits: Unremarkable. Other: None. IMPRESSION: Normal examination. Electronically Signed   By: Claudie Revering M.D.   On: 06/21/2017 19:32   Ct Abdomen Pelvis W Contrast  Result Date: 06/21/2017 CLINICAL DATA:  Pain and burning with urination for the past 3 days. Fever. Fell today. EXAM: CT ABDOMEN AND PELVIS WITH CONTRAST TECHNIQUE:  Multidetector CT imaging of the abdomen and pelvis was performed using the standard protocol following bolus administration of intravenous  contrast. CONTRAST:  79mL ISOVUE-370 IOPAMIDOL (ISOVUE-370) INJECTION 76% COMPARISON:  None. FINDINGS: Lower chest: Minimal left lower lobe dependent atelectasis. Hepatobiliary: Diffuse periportal edema, most pronounced in the lateral segment of the left lower lobe, with mild intrahepatic biliary ductal dilatation, more pronounced in the lateral segment of the left lobe. No extrahepatic biliary dilatation seen. Normal appearing gallbladder. Pancreas: Unremarkable. No pancreatic ductal dilatation or surrounding inflammatory changes. Spleen: Multiple small calcified granulomata. Adrenals/Urinary Tract: Normal appearing adrenal glands. Two tiny right renal cysts. 2.1 cm left renal cyst containing a small amount of coarse curvilinear peripheral calcification. Small exophytic lower pole left renal cyst with mild adjacent edema. Tiny lower pole left renal cyst. Mild diffuse bladder wall thickening. Normal appearing ureters. No ureteral calculi or hydronephrosis seen. Stomach/Bowel: Prominent stool in the colon. Surgically absent appendix. Unremarkable small bowel and stomach. Vascular/Lymphatic: No significant vascular findings are present. No enlarged abdominal or pelvic lymph nodes. Reproductive: Moderately enlarged and mildly heterogeneous prostate gland with protrusion of the median lobe into the base of the urinary bladder. Other: Small bilateral inguinal hernias containing fat. Musculoskeletal: Lumbar and lower thoracic spine degenerative changes. IMPRESSION: 1. Mild intrahepatic biliary ductal dilatation with diffuse periportal edema, most pronounced in the lateral segment of the left lobe. These changes could be due to a centrally obstructing biliary mass and secondary cholangitis or due to hepatitis. 2. Tiny, nonobstructing lower pole left renal calculus. 3. Mild diffuse bladder wall thickening, most likely due to chronic bladder outlet obstruction by the enlarged prostate gland. Cystitis can also produce this  appearance. 4. Moderate prostatic hypertrophy. Electronically Signed   By: Claudie Revering M.D.   On: 06/21/2017 19:28   Dg Chest Port 1 View  Result Date: 06/21/2017 CLINICAL DATA:  57 y/o  M; fever. EXAM: PORTABLE CHEST 1 VIEW COMPARISON:  None. FINDINGS: The heart size and mediastinal contours are within normal limits. Both lungs are clear. The visualized skeletal structures are unremarkable. IMPRESSION: No active disease. Electronically Signed   By: Kristine Garbe M.D.   On: 06/21/2017 15:03    EKG: Orders placed or performed during the hospital encounter of 06/22/17  . ED EKG  . ED EKG    IMPRESSION AND PLAN:  * Sepsis, bacteremia   Secondary to UTI    IV Vanco and cefepime for now and follow cultures.   Infectious disease consult to help   CT scan of abdomen was showing some intraductal obstruction versus abnormality on Levaquin, will get MRCP   Patient has some complain of earache, but no local tenderness and CT scan of the head does not show any abnormalities, continue broad-spectrum antibiotics for now.  * urinary retention   This could be secondary to UTI and BPH   Tamsulosine.  * elevated troponin   This is secondary to stress, came down on further follow-up.  * hypokalemia   Replace oral.  All the records are reviewed and case discussed with ED provider. Management plans discussed with the patient, family and they are in agreement.  CODE STATUS: full.    Code Status Orders  (From admission, onward)        Start     Ordered   06/22/17 1053  Full code  Continuous     06/22/17 1052    Code Status History    This patient has a current code status but no historical code  status.       TOTAL TIME TAKING CARE OF THIS PATIENT: 50 minutes.    Vaughan Basta M.D on 06/22/2017   Between 7am to 6pm - Pager - [phone]  After 6pm go to [email or web address] - password [email or web address] [facility]  Office  [phone]  CC: Primary care  physician; Pleas Koch, NP   Note: This dictation was prepared with Dragon dictation along with smaller phrase technology. Any transcriptional errors that result from this process are unintentional.

## 2017-06-22 NOTE — ED Provider Notes (Addendum)
[facility] Emergency Department Provider Note  ____________________________________________   I have reviewed the triage vital signs and the nursing notes. Where available I have reviewed prior notes and, if possible and indicated, outside hospital notes.    HISTORY  Chief Complaint Abnormal Lab    HPI Willie Good is a 57 y.o. male whom I personally called this morning because of positive blood cultures I have discussed with our pharmacy culture experts, given the bacteria present, in Default of sensitivities they do recommend 2 g of Rocephin which we have ordered.  Patient is well-appearing, at this time however given bacteremia he will need to be admitted for IV antibiotics.  The fact that he felt better this morning, after Rocephin is a I think positive prognostic indicator for the efficacy of this medication.  Briefly therefore patient presented with fevers, riggors, and urinary symptoms yesterday as well as confusion,  had a Foley as of this last visit last night, got Rocephin felt better, but is positive for bacteria in his blood and we are treating him and will admit.  Of note, patient also had elevated cardiac enzymes yesterday, which we will repeat he is not having any chest pain or shortness of breath we will also repeat EKG    Past Medical History:  Diagnosis Date  . Allergy   . Essential hypertension     Patient Active Problem List   Diagnosis Date Noted  . Preventative health care 04/22/2017  . Benign essential HTN 04/05/2015  . 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 APPENDECTOMY      Prior to Admission medications   Medication Sig Start Date End Date Taking? Authorizing Provider  acetaminophen (TYLENOL) 325 MG tablet Take 2 tablets (650 mg total) by mouth every 6 (six) hours as needed. 06/21/17   Carrie Mew, MD  aspirin EC 81 MG tablet Take 1 tablet (81  mg total) by mouth daily. 06/21/17 08/20/17  Carrie Mew, MD  cefdinir (OMNICEF) 300 MG capsule Take 1 capsule (300 mg total) by mouth 2 (two) times daily. 06/21/17   Carrie Mew, MD  Multiple Vitamins-Minerals (MULTIVITAMIN WITH MINERALS) tablet Take 1 tablet by mouth daily.    [provider]  Omega-3 Fatty Acids (FISH OIL PO) Take 2,000 mg by mouth daily.     [provider]    Allergies Patient has no known allergies.  Family History  Problem Relation Age of Onset  . Breast cancer Mother   . Diabetes Father   . Heart disease Father   . Hyperlipidemia Father   . Hypertension Father   . Diabetes Sister   . Cancer Maternal Grandmother   . Cancer Maternal Grandfather   . Cancer Paternal Grandmother   . Diabetes Paternal Grandfather   . Heart disease Paternal Grandfather   . Hyperlipidemia Paternal Grandfather   . Hypertension Paternal Grandfather   . Stroke Paternal Grandfather     Social History Social History   Tobacco Use  . Smoking status: Never Smoker  . Smokeless tobacco: Never Used  Substance Use Topics  . Alcohol use: Yes  . Drug use: No    Review of Systems Constitutional: No fever/chills since he went home Eyes: No visual changes. ENT: No sore throat. No stiff neck no neck pain Cardiovascular: Denies chest pain. Respiratory: Denies shortness of breath. Gastrointestinal:   no vomiting.  No diarrhea.  No constipation. Genitourinary: Foley is draining musculoskeletal:  Negative lower extremity swelling Skin: Negative for rash. Neurological: Negative for severe headaches, focal weakness or numbness.   ____________________________________________   PHYSICAL EXAM:  VITAL SIGNS: ED Triage Vitals [06/22/17 0848]  Enc Vitals Group     BP      Pulse      Resp      Temp      Temp src      SpO2      Weight 185 lb (83.9 kg)     Height 6\' 1"  (1.854 m)     Head Circumference      Peak Flow      Pain Score 4     Pain Loc       Pain Edu?      Excl. in [place]?     Constitutional: Alert and oriented. Well appearing and in no acute distress. Eyes: Conjunctivae are normal Head: Atraumatic HEENT: No congestion/rhinnorhea. Mucous membranes are moist.  Oropharynx non-erythematous Neck:   Nontender with no meningismus, no masses, no stridor Cardiovascular: Normal rate, regular rhythm. Grossly normal heart sounds.  Good peripheral circulation. Respiratory: Normal respiratory effort.  No retractions. Lungs CTAB. Abdominal: Soft and nontender. No distention. No guarding no rebound Back:  There is no focal tenderness or step off.  there is no midline tenderness there are no lesions noted. there is no CVA tenderness Foley in place Musculoskeletal: No lower extremity tenderness, no upper extremity tenderness. No joint effusions, no DVT signs strong distal pulses no edema Neurologic:  Normal speech and language. No gross focal neurologic deficits are appreciated.  Skin:  Skin is warm, dry and intact. No rash noted. Psychiatric: Mood and affect are normal. Speech and behavior are normal.  ____________________________________________   LABS (all labs ordered are listed, but only abnormal results are displayed)  Labs Reviewed  CULTURE, BLOOD (ROUTINE X 2)  CULTURE, BLOOD (ROUTINE X 2)  URINE CULTURE  CBC WITH DIFFERENTIAL/PLATELET  BASIC METABOLIC PANEL  URINALYSIS, COMPLETE (UACMP) WITH MICROSCOPIC  TROPONIN I    Pertinent labs  results that were available during my care of the patient were reviewed by me and considered in my medical decision making (see chart for details). ____________________________________________  EKG  I personally interpreted any EKGs ordered by me or triage Sinus rhythm rate 80 bpm no acute ST elevation or depression normal axis unremarkable EKG ____________________________________________  RADIOLOGY  Pertinent labs & imaging results that were available during my care of the patient were  reviewed by me and considered in my medical decision making (see chart for details). If possible, patient and/or family made aware of any abnormal findings.  Ct Head Wo Contrast  Result Date: 06/21/2017 CLINICAL DATA:  Lightheaded and dizziness.  Fell today.  Fever. EXAM: CT HEAD WITHOUT CONTRAST TECHNIQUE: Contiguous axial images were obtained from the base of the skull through the vertex without intravenous contrast. COMPARISON:  Brain MR dated 02/03/2014 and head CT dated 02/03/2014. FINDINGS: Brain: Normal appearing cerebral hemispheres and posterior fossa structures. Normal size and position of the ventricles. No intracranial hemorrhage, mass lesion or CT evidence of acute infarction. Vascular: No hyperdense vessel or unexpected calcification. Skull: Normal. Negative for fracture or focal lesion. Sinuses/Orbits: Unremarkable. Other: None. IMPRESSION: Normal examination. Electronically Signed   By: Claudie Revering M.D.   On: 06/21/2017 19:32   Ct Abdomen Pelvis W Contrast  Result Date: 06/21/2017 CLINICAL DATA:  Pain and burning with urination for the past 3 days. Fever. Fell today. EXAM: CT ABDOMEN AND PELVIS WITH  CONTRAST TECHNIQUE: Multidetector CT imaging of the abdomen and pelvis was performed using the standard protocol following bolus administration of intravenous contrast. CONTRAST:  90mL ISOVUE-370 IOPAMIDOL (ISOVUE-370) INJECTION 76% COMPARISON:  None. FINDINGS: Lower chest: Minimal left lower lobe dependent atelectasis. Hepatobiliary: Diffuse periportal edema, most pronounced in the lateral segment of the left lower lobe, with mild intrahepatic biliary ductal dilatation, more pronounced in the lateral segment of the left lobe. No extrahepatic biliary dilatation seen. Normal appearing gallbladder. Pancreas: Unremarkable. No pancreatic ductal dilatation or surrounding inflammatory changes. Spleen: Multiple small calcified granulomata. Adrenals/Urinary Tract: Normal appearing adrenal glands. Two  tiny right renal cysts. 2.1 cm left renal cyst containing a small amount of coarse curvilinear peripheral calcification. Small exophytic lower pole left renal cyst with mild adjacent edema. Tiny lower pole left renal cyst. Mild diffuse bladder wall thickening. Normal appearing ureters. No ureteral calculi or hydronephrosis seen. Stomach/Bowel: Prominent stool in the colon. Surgically absent appendix. Unremarkable small bowel and stomach. Vascular/Lymphatic: No significant vascular findings are present. No enlarged abdominal or pelvic lymph nodes. Reproductive: Moderately enlarged and mildly heterogeneous prostate gland with protrusion of the median lobe into the base of the urinary bladder. Other: Small bilateral inguinal hernias containing fat. Musculoskeletal: Lumbar and lower thoracic spine degenerative changes. IMPRESSION: 1. Mild intrahepatic biliary ductal dilatation with diffuse periportal edema, most pronounced in the lateral segment of the left lobe. These changes could be due to a centrally obstructing biliary mass and secondary cholangitis or due to hepatitis. 2. Tiny, nonobstructing lower pole left renal calculus. 3. Mild diffuse bladder wall thickening, most likely due to chronic bladder outlet obstruction by the enlarged prostate gland. Cystitis can also produce this appearance. 4. Moderate prostatic hypertrophy. Electronically Signed   By: Claudie Revering M.D.   On: 06/21/2017 19:28   Dg Chest Port 1 View  Result Date: 06/21/2017 CLINICAL DATA:  57 y/o  M; fever. EXAM: PORTABLE CHEST 1 VIEW COMPARISON:  None. FINDINGS: The heart size and mediastinal contours are within normal limits. Both lungs are clear. The visualized skeletal structures are unremarkable. IMPRESSION: No active disease. Electronically Signed   By: Kristine Garbe M.D.   On: 06/21/2017 15:03   ____________________________________________    PROCEDURES  Procedure(s) performed: None  Procedures  Critical Care  performed: None  ____________________________________________   INITIAL IMPRESSION / ASSESSMENT AND PLAN / ED COURSE  Pertinent labs & imaging results that were available during my care of the patient were reviewed by me and considered in my medical decision making (see chart for details).  Here with bacteremia, we will give him IV Rocephin repeat cultures, also of concern to me as his troponins yesterday, we will repeat those, repeating EKG as well, patient will be admitted for further evaluation    ____________________________________________   FINAL CLINICAL IMPRESSION(S) / ED DIAGNOSES  Final diagnoses:  Bacteremia      This chart was dictated using voice recognition software.  Despite best efforts to proofread,  errors can occur which can change meaning.      Schuyler Amor, MD 06/22/17 5956    Schuyler Amor, MD 06/22/17 3875    Schuyler Amor, MD 06/23/17 1348

## 2017-06-22 NOTE — Progress Notes (Signed)
Patient has infectious disease consult, no coverage at this time.  Dr. Anselm Jungling aware.  Clarise Cruz, RN

## 2017-06-22 NOTE — ED Notes (Signed)
Date and time results received: 06/22/17 10:16 AM  (use smartphrase ".now" to insert current time)  Test: Troponin Critical Value: 0.07  Name of Provider Notified: Dr. Anselm Jungling  Orders Received? Or Actions Taken?: Acknowledged

## 2017-06-23 LAB — HEPATIC FUNCTION PANEL
ALBUMIN: 2.8 g/dL — AB (ref 3.5–5.0)
ALT: 30 U/L (ref 17–63)
AST: 28 U/L (ref 15–41)
Alkaline Phosphatase: 52 U/L (ref 38–126)
Bilirubin, Direct: <0.1 mg/dL — ABNORMAL LOW (ref 0.1–0.5)
TOTAL PROTEIN: 5.5 g/dL — AB (ref 6.5–8.1)
Total Bilirubin: 0.4 mg/dL (ref 0.3–1.2)

## 2017-06-23 LAB — URINE CULTURE: Culture: NO GROWTH

## 2017-06-23 LAB — BASIC METABOLIC PANEL
Anion gap: 2 — ABNORMAL LOW (ref 5–15)
BUN: 13 mg/dL (ref 6–20)
CALCIUM: 8 mg/dL — AB (ref 8.9–10.3)
CO2: 26 mmol/L (ref 22–32)
CREATININE: 0.96 mg/dL (ref 0.61–1.24)
Chloride: 109 mmol/L (ref 101–111)
Glucose, Bld: 95 mg/dL (ref 65–99)
Potassium: 4 mmol/L (ref 3.5–5.1)
SODIUM: 137 mmol/L (ref 135–145)

## 2017-06-23 LAB — CBC
HCT: 34.9 % — ABNORMAL LOW (ref 40.0–52.0)
Hemoglobin: 11.9 g/dL — ABNORMAL LOW (ref 13.0–18.0)
MCH: 29.7 pg (ref 26.0–34.0)
MCHC: 34.2 g/dL (ref 32.0–36.0)
MCV: 86.9 fL (ref 80.0–100.0)
PLATELETS: 217 10*3/uL (ref 150–440)
RBC: 4.01 MIL/uL — AB (ref 4.40–5.90)
RDW: 13.6 % (ref 11.5–14.5)
WBC: 8.7 10*3/uL (ref 3.8–10.6)

## 2017-06-23 LAB — HIV ANTIBODY (ROUTINE TESTING W REFLEX): HIV SCREEN 4TH GENERATION: NONREACTIVE

## 2017-06-23 MED ORDER — ENOXAPARIN SODIUM 40 MG/0.4ML ~~LOC~~ SOLN
40.0000 mg | SUBCUTANEOUS | Status: DC
  Filled 2017-06-23: qty 0.4

## 2017-06-23 MED ORDER — SODIUM CHLORIDE 0.9 % IV SOLN
2.0000 g | Freq: Every day | INTRAVENOUS | Status: DC
  Administered 2017-06-23: 2 g via INTRAVENOUS
  Filled 2017-06-23 (×2): qty 20

## 2017-06-23 NOTE — Progress Notes (Signed)
[facility] PROGRESS NOTE Date of Admission:  06/22/2017     ID: Willie Good is a 57 y.o. male with Klebiesella bacteremia Active Problems:   Sepsis ([place])   Subjective: No fevers, MRCP neg  ROS  Eleven systems are reviewed and negative except per hpi  Medications:  Antibiotics Given (last 72 hours)    Date/Time Action Medication Dose Rate   06/22/17 0950 New Bag/Given   cefTRIAXone (ROCEPHIN) 2 g in sodium chloride 0.9 % 100 mL IVPB 2 g 200 mL/hr   06/22/17 1022 New Bag/Given   vancomycin (VANCOCIN) IVPB 1000 mg/200 mL premix 1,000 mg 200 mL/hr   06/22/17 1143 New Bag/Given   ceFEPIme (MAXIPIME) 2 g in sodium chloride 0.9 % 100 mL IVPB 2 g 200 mL/hr   06/22/17 1750 New Bag/Given   ceFEPIme (MAXIPIME) 2 g in sodium chloride 0.9 % 100 mL IVPB 2 g 200 mL/hr   06/22/17 1832 New Bag/Given   vancomycin (VANCOCIN) 1,250 mg in sodium chloride 0.9 % 250 mL IVPB 1,250 mg 166.7 mL/hr   06/22/17 2256 New Bag/Given   ceFEPIme (MAXIPIME) 2 g in sodium chloride 0.9 % 100 mL IVPB 2 g 200 mL/hr   06/23/17 0442 New Bag/Given   vancomycin (VANCOCIN) 1,250 mg in sodium chloride 0.9 % 250 mL IVPB 1,250 mg 166.7 mL/hr   06/23/17 0611 New Bag/Given   ceFEPIme (MAXIPIME) 2 g in sodium chloride 0.9 % 100 mL IVPB 2 g 200 mL/hr     . aspirin EC  81 mg Oral Daily  . enoxaparin (LOVENOX) injection  40 mg Subcutaneous Q24H  . multivitamin with minerals  1 tablet Oral Daily  . omega-3 acid ethyl esters  1 g Oral Daily  . tamsulosin  0.4 mg Oral Daily    Objective: Vital signs in last 24 hours: Temp:  [98.3 F (36.8 C)-98.5 F (36.9 C)] 98.3 F (36.8 C) (05/02 0429) Pulse Rate:  [70-73] 73 (05/02 0429) Resp:  [18] 18 (05/02 0429) BP: (128-132)/(88-91) 132/91 (05/02 0429) SpO2:  [99 %] 99 % (05/02 0429) Constitutional: He is oriented to person, place, and time. He appears well-developed and well-nourished. No distress.  HENT:  Mouth/Throat: Oropharynx is clear and moist.  No oropharyngeal exudate.  Cardiovascular: Normal rate, regular rhythm and normal heart sounds. Exam reveals no gallop and no friction rub.  No murmur heard.  Pulmonary/Chest: Effort normal and breath sounds normal. No respiratory distress. He has no wheezes.  Abdominal: Soft. Bowel sounds are normal.  Mild ttp RUQ, and pelvic regions Lymphadenopathy:  He has no cervical adenopathy.  Neurological: He is alert and oriented to person, place, and time.  Skin: Skin is warm and dry. No rash noted. No erythema.  Psychiatric: He has a normal mood and affect. His behavior is normal.     Lab Results Recent Labs    06/22/17 0907 06/23/17 0534  WBC 14.4* 8.7  HGB 12.2* 11.9*  HCT 35.8* 34.9*  NA 131* 137  K 3.3* 4.0  CL 100* 109  CO2 24 26  BUN 17 13  CREATININE 1.09 0.96    Microbiology: Results for orders placed or performed during the hospital encounter of 06/22/17  Culture, blood (routine x 2)     Status: None (Preliminary result)   Collection Time: 06/22/17  9:07 AM  Result Value Ref Range Status   Specimen Description BLOOD LEFT ARM  Final   Special Requests   Final    BOTTLES DRAWN AEROBIC AND ANAEROBIC  Blood Culture results may not be optimal due to an excessive volume of blood received in culture bottles   Culture   Final    NO GROWTH < 24 HOURS Performed at [facility], [address] [place], [place] [address]    Report Status PENDING  Incomplete  Culture, blood (routine x 2)     Status: None (Preliminary result)   Collection Time: 06/22/17  9:07 AM  Result Value Ref Range Status   Specimen Description BLOOD RIGHT ARM  Final   Special Requests   Final    BOTTLES DRAWN AEROBIC AND ANAEROBIC Blood Culture adequate volume   Culture   Final    NO GROWTH < 24 HOURS Performed at [facility], [address] [place], [place] [address]    Report Status PENDING  Incomplete    Studies/Results: Ct Head Wo Contrast  Result Date: 06/21/2017 CLINICAL  DATA:  Lightheaded and dizziness.  Fell today.  Fever. EXAM: CT HEAD WITHOUT CONTRAST TECHNIQUE: Contiguous axial images were obtained from the base of the skull through the vertex without intravenous contrast. COMPARISON:  Brain MR dated 02/03/2014 and head CT dated 02/03/2014. FINDINGS: Brain: Normal appearing cerebral hemispheres and posterior fossa structures. Normal size and position of the ventricles. No intracranial hemorrhage, mass lesion or CT evidence of acute infarction. Vascular: No hyperdense vessel or unexpected calcification. Skull: Normal. Negative for fracture or focal lesion. Sinuses/Orbits: Unremarkable. Other: None. IMPRESSION: Normal examination. Electronically Signed   By: Claudie Revering M.D.   On: 06/21/2017 19:32   Ct Abdomen Pelvis W Contrast  Result Date: 06/21/2017 CLINICAL DATA:  Pain and burning with urination for the past 3 days. Fever. Fell today. EXAM: CT ABDOMEN AND PELVIS WITH CONTRAST TECHNIQUE: Multidetector CT imaging of the abdomen and pelvis was performed using the standard protocol following bolus administration of intravenous contrast. CONTRAST:  110mL ISOVUE-370 IOPAMIDOL (ISOVUE-370) INJECTION 76% COMPARISON:  None. FINDINGS: Lower chest: Minimal left lower lobe dependent atelectasis. Hepatobiliary: Diffuse periportal edema, most pronounced in the lateral segment of the left lower lobe, with mild intrahepatic biliary ductal dilatation, more pronounced in the lateral segment of the left lobe. No extrahepatic biliary dilatation seen. Normal appearing gallbladder. Pancreas: Unremarkable. No pancreatic ductal dilatation or surrounding inflammatory changes. Spleen: Multiple small calcified granulomata. Adrenals/Urinary Tract: Normal appearing adrenal glands. Two tiny right renal cysts. 2.1 cm left renal cyst containing a small amount of coarse curvilinear peripheral calcification. Small exophytic lower pole left renal cyst with mild adjacent edema. Tiny lower pole left renal  cyst. Mild diffuse bladder wall thickening. Normal appearing ureters. No ureteral calculi or hydronephrosis seen. Stomach/Bowel: Prominent stool in the colon. Surgically absent appendix. Unremarkable small bowel and stomach. Vascular/Lymphatic: No significant vascular findings are present. No enlarged abdominal or pelvic lymph nodes. Reproductive: Moderately enlarged and mildly heterogeneous prostate gland with protrusion of the median lobe into the base of the urinary bladder. Other: Small bilateral inguinal hernias containing fat. Musculoskeletal: Lumbar and lower thoracic spine degenerative changes. IMPRESSION: 1. Mild intrahepatic biliary ductal dilatation with diffuse periportal edema, most pronounced in the lateral segment of the left lobe. These changes could be due to a centrally obstructing biliary mass and secondary cholangitis or due to hepatitis. 2. Tiny, nonobstructing lower pole left renal calculus. 3. Mild diffuse bladder wall thickening, most likely due to chronic bladder outlet obstruction by the enlarged prostate gland. Cystitis can also produce this appearance. 4. Moderate prostatic hypertrophy. Electronically Signed   By: Percell Locus.D.  On: 06/21/2017 19:28   Mr 3d Recon At Scanner  Result Date: 06/22/2017 CLINICAL DATA:  Fever, sepsis, periportal edema and intrahepatic biliary ductal dilatation on CT EXAM: MRI ABDOMEN WITHOUT AND WITH CONTRAST (INCLUDING MRCP) TECHNIQUE: Multiplanar multisequence MR imaging of the abdomen was performed both before and after the administration of intravenous contrast. Heavily T2-weighted images of the biliary and pancreatic ducts were obtained, and three-dimensional MRCP images were rendered by post processing. CONTRAST:  58mL MULTIHANCE GADOBENATE DIMEGLUMINE 529 MG/ML IV SOLN COMPARISON:  CT abdomen/pelvis dated 06/21/2017 FINDINGS: Lower chest: Lung bases are clear. Hepatobiliary: Liver is within normal limits. Periportal edema and intrahepatic ductal  dilatation in the left hepatic lobe has resolved. Mild altered perfusion along the falciform ligament. No suspicious/enhancing hepatic lesions. Gallbladder is unremarkable. No intrahepatic or extrahepatic ductal dilatation. No choledocholithiasis is seen. Pancreas:  Within normal limits. Spleen:  Within normal limits. Adrenals/Urinary Tract:  Adrenal glands within normal limits. 8 mm cyst in the medial interpolar right kidney (series 5/image 22). 1.8 cm cyst in the posterior left lower kidney (series 5/image 26). No hydronephrosis. Stomach/Bowel: Stomach and visualized bowel are unremarkable. Vascular/Lymphatic:  No evidence of abdominal aortic aneurysm. No suspicious abdominal lymphadenopathy. Other:  No abdominal ascites. Musculoskeletal: No focal osseous lesions. IMPRESSION: Liver is within normal limits. Prior periportal edema and mild intrahepatic ductal dilatation in the left hepatic lobe has resolved. No intrahepatic or extrahepatic ductal dilatation. No choledocholithiasis is seen. Electronically Signed   By: Julian Hy M.D.   On: 06/22/2017 21:31   Dg Chest Port 1 View  Result Date: 06/21/2017 CLINICAL DATA:  57 y/o  M; fever. EXAM: PORTABLE CHEST 1 VIEW COMPARISON:  None. FINDINGS: The heart size and mediastinal contours are within normal limits. Both lungs are clear. The visualized skeletal structures are unremarkable. IMPRESSION: No active disease. Electronically Signed   By: Kristine Garbe M.D.   On: 06/21/2017 15:03   Mr Abdomen Mrcp Moise Boring Contast  Result Date: 06/22/2017 CLINICAL DATA:  Fever, sepsis, periportal edema and intrahepatic biliary ductal dilatation on CT EXAM: MRI ABDOMEN WITHOUT AND WITH CONTRAST (INCLUDING MRCP) TECHNIQUE: Multiplanar multisequence MR imaging of the abdomen was performed both before and after the administration of intravenous contrast. Heavily T2-weighted images of the biliary and pancreatic ducts were obtained, and three-dimensional MRCP images  were rendered by post processing. CONTRAST:  43mL MULTIHANCE GADOBENATE DIMEGLUMINE 529 MG/ML IV SOLN COMPARISON:  CT abdomen/pelvis dated 06/21/2017 FINDINGS: Lower chest: Lung bases are clear. Hepatobiliary: Liver is within normal limits. Periportal edema and intrahepatic ductal dilatation in the left hepatic lobe has resolved. Mild altered perfusion along the falciform ligament. No suspicious/enhancing hepatic lesions. Gallbladder is unremarkable. No intrahepatic or extrahepatic ductal dilatation. No choledocholithiasis is seen. Pancreas:  Within normal limits. Spleen:  Within normal limits. Adrenals/Urinary Tract:  Adrenal glands within normal limits. 8 mm cyst in the medial interpolar right kidney (series 5/image 22). 1.8 cm cyst in the posterior left lower kidney (series 5/image 26). No hydronephrosis. Stomach/Bowel: Stomach and visualized bowel are unremarkable. Vascular/Lymphatic:  No evidence of abdominal aortic aneurysm. No suspicious abdominal lymphadenopathy. Other:  No abdominal ascites. Musculoskeletal: No focal osseous lesions. IMPRESSION: Liver is within normal limits. Prior periportal edema and mild intrahepatic ductal dilatation in the left hepatic lobe has resolved. No intrahepatic or extrahepatic ductal dilatation. No choledocholithiasis is seen. Electronically Signed   By: Julian Hy M.D.   On: 06/22/2017 21:31    Assessment/Plan: JOTHAM AHN is a 57 y.o. male with hx  possible BPH and prior hx chronic prostatitis admitted with + [place] and coag neg staph. Originally had some dysuria, diff with urine stream and then rigors and confusion. Had to have foley placed in ED due to urinary retention, UA with 21-50 wbc, ucx pending. CT showed biliary dilatation and periportal edema. Does have some RUQ abd pain. LFTs initially nml. Likely has UTI due to urinary retention but some concern for biliary process with RUQ pain and abnml CT abd.  5/2 - MRCP neg, no  fevers.  Recommendations Change to ceftriaxone from cefepime. DC vanco-  coag neg staph likely contaminant If stable can dc tomorrow on oral regimen based on sensitivities for a total abx duration of 10 days.  Thank you very much for the consult. Will follow with you.  Willie Good   06/23/2017, 1:40 PM

## 2017-06-23 NOTE — Plan of Care (Signed)
Pt denies pain during the shift. Reported slight burning with urination. Foley removed, voided without difficulty.

## 2017-06-23 NOTE — Progress Notes (Signed)
[facility] [place] NAME: Willie Good    MR#:  [id number]  DATE OF BIRTH:  1960/06/08  SUBJECTIVE:  CHIEF COMPLAINT:   Chief Complaint  Patient presents with  . Abnormal Lab    no fever, no pain, feels much better and stronger today.  REVIEW OF SYSTEMS:  CONSTITUTIONAL: No fever, fatigue or weakness.  EYES: No blurred or double vision.  EARS, NOSE, AND THROAT: No tinnitus or ear pain.  RESPIRATORY: No cough, shortness of breath, wheezing or hemoptysis.  CARDIOVASCULAR: No chest pain, orthopnea, edema.  GASTROINTESTINAL: No nausea, vomiting, diarrhea or abdominal pain.  GENITOURINARY: No dysuria, hematuria.  ENDOCRINE: No polyuria, nocturia,  HEMATOLOGY: No anemia, easy bruising or bleeding SKIN: No rash or lesion. MUSCULOSKELETAL: No joint pain or arthritis.   NEUROLOGIC: No tingling, numbness, weakness.  PSYCHIATRY: No anxiety or depression.   ROS  DRUG ALLERGIES:  No Known Allergies  VITALS:  Blood pressure 128/84, pulse 70, temperature 97.8 F (36.6 C), temperature source Oral, resp. rate 18, height 6\' 1"  (1.854 m), weight 84.2 kg (185 lb 9.6 oz), SpO2 99 %.  PHYSICAL EXAMINATION:   GENERAL:  57 y.o.-year-old patient lying in the bed with no acute distress.  EYES: Pupils equal, round, reactive to light and accommodation. No scleral icterus. Extraocular muscles intact.  HEENT: Head atraumatic, normocephalic. Oropharynx and nasopharynx clear. No tenderness on and around ears. NECK:  Supple, no jugular venous distention. No thyroid enlargement, no tenderness.  LUNGS: Normal breath sounds bilaterally, no wheezing, rales,rhonchi or crepitation. No use of accessory muscles of respiration.  CARDIOVASCULAR: S1, S2 normal. No murmurs, rubs, or gallops.  ABDOMEN: Soft, nontender, nondistended. Bowel sounds present. No organomegaly or mass. Foley catheter in place. EXTREMITIES: No pedal edema, cyanosis, or clubbing.  NEUROLOGIC:  Cranial nerves II through XII are intact. Muscle strength 5/5 in all extremities. Sensation intact. Gait not checked.  PSYCHIATRIC: The patient is alert and oriented x 3.  SKIN: No obvious rash, lesion, or ulcer.     Physical Exam LABORATORY PANEL:   CBC Recent Labs  Lab 06/23/17 0534  WBC 8.7  HGB 11.9*  HCT 34.9*  PLT 217   ------------------------------------------------------------------------------------------------------------------  Chemistries  Recent Labs  Lab 06/23/17 0534  NA 137  K 4.0  CL 109  CO2 26  GLUCOSE 95  BUN 13  CREATININE 0.96  CALCIUM 8.0*  AST 28  ALT 30  ALKPHOS 52  BILITOT 0.4   ------------------------------------------------------------------------------------------------------------------  Cardiac Enzymes Recent Labs  Lab 06/21/17 2010 06/22/17 0907  TROPONINI 0.25* 0.07*   ------------------------------------------------------------------------------------------------------------------  RADIOLOGY:  Ct Head Wo Contrast  Result Date: 06/21/2017 CLINICAL DATA:  Lightheaded and dizziness.  Fell today.  Fever. EXAM: CT HEAD WITHOUT CONTRAST TECHNIQUE: Contiguous axial images were obtained from the base of the skull through the vertex without intravenous contrast. COMPARISON:  Brain MR dated 02/03/2014 and head CT dated 02/03/2014. FINDINGS: Brain: Normal appearing cerebral hemispheres and posterior fossa structures. Normal size and position of the ventricles. No intracranial hemorrhage, mass lesion or CT evidence of acute infarction. Vascular: No hyperdense vessel or unexpected calcification. Skull: Normal. Negative for fracture or focal lesion. Sinuses/Orbits: Unremarkable. Other: None. IMPRESSION: Normal examination. Electronically Signed   By: Claudie Revering M.D.   On: 06/21/2017 19:32   Ct Abdomen Pelvis W Contrast  Result Date: 06/21/2017 CLINICAL DATA:  Pain and burning with urination for the past 3 days. Fever. Fell today. EXAM: CT  ABDOMEN AND PELVIS WITH CONTRAST  TECHNIQUE: Multidetector CT imaging of the abdomen and pelvis was performed using the standard protocol following bolus administration of intravenous contrast. CONTRAST:  4mL ISOVUE-370 IOPAMIDOL (ISOVUE-370) INJECTION 76% COMPARISON:  None. FINDINGS: Lower chest: Minimal left lower lobe dependent atelectasis. Hepatobiliary: Diffuse periportal edema, most pronounced in the lateral segment of the left lower lobe, with mild intrahepatic biliary ductal dilatation, more pronounced in the lateral segment of the left lobe. No extrahepatic biliary dilatation seen. Normal appearing gallbladder. Pancreas: Unremarkable. No pancreatic ductal dilatation or surrounding inflammatory changes. Spleen: Multiple small calcified granulomata. Adrenals/Urinary Tract: Normal appearing adrenal glands. Two tiny right renal cysts. 2.1 cm left renal cyst containing a small amount of coarse curvilinear peripheral calcification. Small exophytic lower pole left renal cyst with mild adjacent edema. Tiny lower pole left renal cyst. Mild diffuse bladder wall thickening. Normal appearing ureters. No ureteral calculi or hydronephrosis seen. Stomach/Bowel: Prominent stool in the colon. Surgically absent appendix. Unremarkable small bowel and stomach. Vascular/Lymphatic: No significant vascular findings are present. No enlarged abdominal or pelvic lymph nodes. Reproductive: Moderately enlarged and mildly heterogeneous prostate gland with protrusion of the median lobe into the base of the urinary bladder. Other: Small bilateral inguinal hernias containing fat. Musculoskeletal: Lumbar and lower thoracic spine degenerative changes. IMPRESSION: 1. Mild intrahepatic biliary ductal dilatation with diffuse periportal edema, most pronounced in the lateral segment of the left lobe. These changes could be due to a centrally obstructing biliary mass and secondary cholangitis or due to hepatitis. 2. Tiny, nonobstructing lower  pole left renal calculus. 3. Mild diffuse bladder wall thickening, most likely due to chronic bladder outlet obstruction by the enlarged prostate gland. Cystitis can also produce this appearance. 4. Moderate prostatic hypertrophy. Electronically Signed   By: Claudie Revering M.D.   On: 06/21/2017 19:28   Mr 3d Recon At Scanner  Result Date: 06/22/2017 CLINICAL DATA:  Fever, sepsis, periportal edema and intrahepatic biliary ductal dilatation on CT EXAM: MRI ABDOMEN WITHOUT AND WITH CONTRAST (INCLUDING MRCP) TECHNIQUE: Multiplanar multisequence MR imaging of the abdomen was performed both before and after the administration of intravenous contrast. Heavily T2-weighted images of the biliary and pancreatic ducts were obtained, and three-dimensional MRCP images were rendered by post processing. CONTRAST:  77mL MULTIHANCE GADOBENATE DIMEGLUMINE 529 MG/ML IV SOLN COMPARISON:  CT abdomen/pelvis dated 06/21/2017 FINDINGS: Lower chest: Lung bases are clear. Hepatobiliary: Liver is within normal limits. Periportal edema and intrahepatic ductal dilatation in the left hepatic lobe has resolved. Mild altered perfusion along the falciform ligament. No suspicious/enhancing hepatic lesions. Gallbladder is unremarkable. No intrahepatic or extrahepatic ductal dilatation. No choledocholithiasis is seen. Pancreas:  Within normal limits. Spleen:  Within normal limits. Adrenals/Urinary Tract:  Adrenal glands within normal limits. 8 mm cyst in the medial interpolar right kidney (series 5/image 22). 1.8 cm cyst in the posterior left lower kidney (series 5/image 26). No hydronephrosis. Stomach/Bowel: Stomach and visualized bowel are unremarkable. Vascular/Lymphatic:  No evidence of abdominal aortic aneurysm. No suspicious abdominal lymphadenopathy. Other:  No abdominal ascites. Musculoskeletal: No focal osseous lesions. IMPRESSION: Liver is within normal limits. Prior periportal edema and mild intrahepatic ductal dilatation in the left  hepatic lobe has resolved. No intrahepatic or extrahepatic ductal dilatation. No choledocholithiasis is seen. Electronically Signed   By: Julian Hy M.D.   On: 06/22/2017 21:31   Mr Abdomen Mrcp Moise Boring Contast  Result Date: 06/22/2017 CLINICAL DATA:  Fever, sepsis, periportal edema and intrahepatic biliary ductal dilatation on CT EXAM: MRI ABDOMEN WITHOUT AND WITH CONTRAST (INCLUDING MRCP) TECHNIQUE:  Multiplanar multisequence MR imaging of the abdomen was performed both before and after the administration of intravenous contrast. Heavily T2-weighted images of the biliary and pancreatic ducts were obtained, and three-dimensional MRCP images were rendered by post processing. CONTRAST:  46mL MULTIHANCE GADOBENATE DIMEGLUMINE 529 MG/ML IV SOLN COMPARISON:  CT abdomen/pelvis dated 06/21/2017 FINDINGS: Lower chest: Lung bases are clear. Hepatobiliary: Liver is within normal limits. Periportal edema and intrahepatic ductal dilatation in the left hepatic lobe has resolved. Mild altered perfusion along the falciform ligament. No suspicious/enhancing hepatic lesions. Gallbladder is unremarkable. No intrahepatic or extrahepatic ductal dilatation. No choledocholithiasis is seen. Pancreas:  Within normal limits. Spleen:  Within normal limits. Adrenals/Urinary Tract:  Adrenal glands within normal limits. 8 mm cyst in the medial interpolar right kidney (series 5/image 22). 1.8 cm cyst in the posterior left lower kidney (series 5/image 26). No hydronephrosis. Stomach/Bowel: Stomach and visualized bowel are unremarkable. Vascular/Lymphatic:  No evidence of abdominal aortic aneurysm. No suspicious abdominal lymphadenopathy. Other:  No abdominal ascites. Musculoskeletal: No focal osseous lesions. IMPRESSION: Liver is within normal limits. Prior periportal edema and mild intrahepatic ductal dilatation in the left hepatic lobe has resolved. No intrahepatic or extrahepatic ductal dilatation. No choledocholithiasis is seen.  Electronically Signed   By: Julian Hy M.D.   On: 06/22/2017 21:31    ASSESSMENT AND PLAN:   Active Problems:   Sepsis ([place])   * Sepsis, bacteremia- klebsiella   Secondary to UTI    IV Vanco and cefepime for now and follow cultures.   Infectious disease consult to help   CT scan of abdomen was showing some intraductal obstruction versus abnormality on Liver, Negative MRCP and LFTs are normal now.   Patient has some complain of earache, but no local tenderness and CT scan of the head does not show any abnormalities,    As per ID, Likely coag neg staph is contamination and changed to rocephin for klebsiella   Awaited final cx result.  * urinary retention   This could be secondary to UTI and BPH   Tamsulosine.    D/c foley today and check for obstruction.  * elevated troponin   This is secondary to stress, came down on further follow-up.  * hypokalemia   Replace oral.     All the records are reviewed and case discussed with Care Management/Social Workerr. Management plans discussed with the patient, family and they are in agreement.  CODE STATUS: full.  TOTAL TIME TAKING CARE OF THIS PATIENT: 35 minutes.     POSSIBLE D/C IN 1-2 DAYS, DEPENDING ON CLINICAL CONDITION.   Vaughan Basta M.D on 06/23/2017   Between 7am to 6pm - Pager - [phone]  After 6pm go to [email or web address] - password [email or web address] [facility]  Office  [phone]  CC: Primary care physician; Pleas Koch, NP  Note: This dictation was prepared with Dragon dictation along with smaller phrase technology. Any transcriptional errors that result from this process are unintentional.

## 2017-06-23 NOTE — Progress Notes (Signed)
Pharmacy Antibiotic Note  Willie Good is a 57 y.o. male admitted on 06/22/2017 with bacteremia/UTI.  Pharmacy has been consulted for Ceftriaxone dosing.   BCID: +GNR, Staph species, Klebsiella oxytoca from ER visit 06/21/17  Plan: Patient previously received Ceftriaxone 2g once followed by cefepime and vancomycin. Per Dr. Ola Spurr, want to de-escalate to ceftriaxone.   Will start ceftriaxone 2g Q24H. Pharmacy will continue to follow.   Height: 6\' 1"  (185.4 cm) Weight: 185 lb 9.6 oz (84.2 kg) IBW/kg (Calculated) : 79.9  Temp (24hrs), Avg:98.4 F (36.9 C), Min:98.3 F (36.8 C), Max:98.5 F (36.9 C)  Recent Labs  Lab 06/21/17 1336 06/21/17 1436 06/21/17 1555 06/22/17 0907 06/22/17 0911 06/22/17 1156 06/23/17 0534  WBC 12.0*  --   --  14.4*  --   --  8.7  CREATININE  --   --  1.22 1.09  --   --  0.96  LATICACIDVEN  --  1.6  --   --  1.4 1.3  --     Estimated Creatinine Clearance: 95.9 mL/min (by C-G formula based on SCr of 0.96 mg/dL).    No Known Allergies  Antimicrobials this admission: CTX 5/1 >> x once; 5/2>> Cefepime  5/1 >>  5/2 VANC 5/1 >> 5/2  Dose adjustments this admission:    Microbiology results: 5/1 Rpt BCx: NGTD (BCID: +GNR, Staph species, Klebsiella oxytoca from ER visit 06/21/17) 5/1 UCx: NGTD   Thank you for allowing pharmacy to be a part of this patient's care.  Lendon Ka, PharmD Pharmacy Resident 06/23/2017 1:58 PM

## 2017-06-23 NOTE — Care Management (Signed)
Patient seen in the ED 4/30 and discharged with treatment for UTI. foley was placed. Was called to return to the ED due to positive blood cultures.  ID consult present.  At present, it is too early to tell if there is going to possibly be a need for long term IV antibiotics

## 2017-06-24 LAB — CULTURE, BLOOD (ROUTINE X 2): SPECIAL REQUESTS: ADEQUATE

## 2017-06-24 MED ORDER — CEPHALEXIN 500 MG PO CAPS: 500.0000 mg | ORAL_CAPSULE | Freq: Two times a day (BID) | ORAL | 0 refills | Status: AC

## 2017-06-24 MED ORDER — TAMSULOSIN HCL 0.4 MG PO CAPS: 0.4000 mg | ORAL_CAPSULE | Freq: Every day | ORAL | 0 refills | Status: DC

## 2017-06-24 NOTE — Progress Notes (Signed)
Patient discharged home with spouse. Patient verbalized understanding of education. Patient discharged with no complaints.

## 2017-06-24 NOTE — Progress Notes (Signed)
Pharmacy Antibiotic Note  Willie Good is a 57 y.o. male admitted on 06/22/2017 with bacteremia/UTI.  Pharmacy has been consulted for Ceftriaxone dosing.   BCID: +GNR, Staph species, Klebsiella oxytoca from ER visit 06/21/17  Plan: Continue ceftriaxone 2 g IV Q24H. Pharmacy will continue to follow.   Height: 6\' 1"  (185.4 cm) Weight: 185 lb 9.6 oz (84.2 kg) IBW/kg (Calculated) : 79.9  Temp (24hrs), Avg:97.9 F (36.6 C), Min:97.6 F (36.4 C), Max:98.2 F (36.8 C)  Recent Labs  Lab 06/21/17 1336 06/21/17 1436 06/21/17 1555 06/22/17 0907 06/22/17 0911 06/22/17 1156 06/23/17 0534  WBC 12.0*  --   --  14.4*  --   --  8.7  CREATININE  --   --  1.22 1.09  --   --  0.96  LATICACIDVEN  --  1.6  --   --  1.4 1.3  --     Estimated Creatinine Clearance: 95.9 mL/min (by C-G formula based on SCr of 0.96 mg/dL).    No Known Allergies  Antimicrobials this admission: CTX 5/1 >> x once; 5/2>> Cefepime  5/1 >>  5/2 VANC 5/1 >> 5/2  Dose adjustments this admission:    Microbiology results: 4/30 BCx 2/2 Kleb oxytoca 5/1 Rpt BCx: NGTD (BCID: +GNR, Staph species, Klebsiella oxytoca from ER visit 06/21/17) 5/1 UCx: NG   Thank you for allowing pharmacy to be a part of this patient's care.  Rayna Sexton, PharmD, BCPS Clinical Pharmacist 06/24/2017 8:47 AM

## 2017-06-27 LAB — CULTURE, BLOOD (ROUTINE X 2)
Culture: NO GROWTH
Culture: NO GROWTH
SPECIAL REQUESTS: ADEQUATE

## 2017-06-28 ENCOUNTER — Telehealth: Payer: Self-pay

## 2017-06-28 NOTE — Telephone Encounter (Signed)
EMMI Follow-up: Pt. left a message as he received a call from my number on Sunday.  I was out of the office on Monday so following up today to see if he had any concerns.  Unable to reach patient so left a VM.

## 2017-06-29 NOTE — Discharge Summary (Signed)
[facility] [facility] [place] NAME: Willie Good    MR#:  [id number]  DATE OF BIRTH:  [birth year]  DATE OF ADMISSION:  06/22/2017 ADMITTING PHYSICIAN: Vaughan Basta, MD  DATE OF DISCHARGE: 06/24/2017 12:02 PM  PRIMARY CARE PHYSICIAN: Pleas Koch, NP    ADMISSION DIAGNOSIS:  Bacteremia [R78.81]  DISCHARGE DIAGNOSIS:  Active Problems:   Sepsis ([place])   Bacteremia  SECONDARY DIAGNOSIS:   Past Medical History:  Diagnosis Date  . Allergy   . Essential hypertension     HOSPITAL COURSE:    * Sepsis, bacteremia- klebsiella Secondary to UTI  IV Vanco and cefepime for now and follow cultures. Infectious disease consult to help CT scan of abdomen was showing some intraductal obstruction versus abnormality on Liver, Negative MRCP and LFTs are normal now. Patient has some complain of earache, but no local tenderness and CT scan of the head does not show any abnormalities,    As per ID, Likely coag neg staph is contamination and changed to rocephin for klebsiella   Reviewed final cx results and d/c on oral Abx per ID recommendations.  * urinary retention This could be secondary to UTI and BPH Tamsulosine.    D/c foley today and check for obstruction.   He had good urine output.  * elevated troponin This is secondary to stress, came down on further follow-up.  * hypokalemia Replace oral.     DISCHARGE CONDITIONS:   Stable.  CONSULTS OBTAINED:  Treatment Team:  Leonel Ramsay, MD  DRUG ALLERGIES:  No Known Allergies  DISCHARGE MEDICATIONS:   Allergies as of 06/24/2017   No Known Allergies     Medication List    STOP taking these medications   cefdinir 300 MG capsule Commonly known as:  OMNICEF     TAKE these medications   acetaminophen 325 MG tablet Commonly known as:  TYLENOL Take 2 tablets (650 mg total) by mouth every 6 (six) hours as needed.   aspirin EC 81 MG  tablet Take 1 tablet (81 mg total) by mouth daily.   cephALEXin 500 MG capsule Commonly known as:  KEFLEX Take 1 capsule (500 mg total) by mouth 2 (two) times daily for 8 days.   FISH OIL PO Take 2,000 mg by mouth daily.   multivitamin with minerals tablet Take 1 tablet by mouth daily.   tamsulosin 0.4 MG Caps capsule Commonly known as:  FLOMAX Take 1 capsule (0.4 mg total) by mouth daily.        DISCHARGE INSTRUCTIONS:   Follow with PMD in 1 week.  If you experience worsening of your admission symptoms, develop shortness of breath, life threatening emergency, suicidal or homicidal thoughts you must seek medical attention immediately by calling 911 or calling your MD immediately  if symptoms less severe.  You Must read complete instructions/literature along with all the possible adverse reactions/side effects for all the Medicines you take and that have been prescribed to you. Take any new Medicines after you have completely understood and accept all the possible adverse reactions/side effects.   Please note  You were cared for by a hospitalist during your hospital stay. If you have any questions about your discharge medications or the care you received while you were in the hospital after you are discharged, you can call the unit and asked to speak with the hospitalist on call if the hospitalist that took care of you is not available. Once you are discharged, your  primary care physician will handle any further medical issues. Please note that NO REFILLS for any discharge medications will be authorized once you are discharged, as it is imperative that you return to your primary care physician (or establish a relationship with a primary care physician if you do not have one) for your aftercare needs so that they can reassess your need for medications and monitor your lab values.    Today   CHIEF COMPLAINT:   Chief Complaint  Patient presents with  . Abnormal Lab    HISTORY  OF PRESENT ILLNESS:  Willie Good  is a 57 y.o. male with a known history of essential hypertension- came to emergency room with urinary symptoms and fever- sent home from ER with Keflex and Urinary catheter. His blood cx reported positive so he was called in to come back in ER today. Also have c/o both ear ache today. Pt was very confused and disoriented yesterday before coming to ER, but is alert and oriented now.   VITAL SIGNS:  Blood pressure (!) 145/94, pulse (!) 53, temperature 97.6 F (36.4 C), temperature source Axillary, resp. rate 16, height 6\' 1"  (1.854 m), weight 84.2 kg (185 lb 9.6 oz), SpO2 100 %.  I/O:  No intake or output data in the 24 hours ending 06/29/17 1728  PHYSICAL EXAMINATION:  GENERAL:  57 y.o.-year-old patient lying in the bed with no acute distress.  EYES: Pupils equal, round, reactive to light and accommodation. No scleral icterus. Extraocular muscles intact.  HEENT: Head atraumatic, normocephalic. Oropharynx and nasopharynx clear.  NECK:  Supple, no jugular venous distention. No thyroid enlargement, no tenderness.  LUNGS: Normal breath sounds bilaterally, no wheezing, rales,rhonchi or crepitation. No use of accessory muscles of respiration.  CARDIOVASCULAR: S1, S2 normal. No murmurs, rubs, or gallops.  ABDOMEN: Soft, non-tender, non-distended. Bowel sounds present. No organomegaly or mass.  EXTREMITIES: No pedal edema, cyanosis, or clubbing.  NEUROLOGIC: Cranial nerves II through XII are intact. Muscle strength 5/5 in all extremities. Sensation intact. Gait not checked.  PSYCHIATRIC: The patient is alert and oriented x 3.  SKIN: No obvious rash, lesion, or ulcer.   DATA REVIEW:   CBC Recent Labs  Lab 06/23/17 0534  WBC 8.7  HGB 11.9*  HCT 34.9*  PLT 217    Chemistries  Recent Labs  Lab 06/23/17 0534  NA 137  K 4.0  CL 109  CO2 26  GLUCOSE 95  BUN 13  CREATININE 0.96  CALCIUM 8.0*  AST 28  ALT 30  ALKPHOS 52  BILITOT 0.4    Cardiac  Enzymes No results for input(s): TROPONINI in the last 168 hours.  Microbiology Results  Results for orders placed or performed during the hospital encounter of 06/22/17  Culture, blood (routine x 2)     Status: None   Collection Time: 06/22/17  9:07 AM  Result Value Ref Range Status   Specimen Description BLOOD LEFT ARM  Final   Special Requests   Final    BOTTLES DRAWN AEROBIC AND ANAEROBIC Blood Culture results may not be optimal due to an excessive volume of blood received in culture bottles   Culture   Final    NO GROWTH 5 DAYS Performed at [facility], [address] [place], [place] [address]    Report Status 06/27/2017 FINAL  Final  Culture, blood (routine x 2)     Status: None   Collection Time: 06/22/17  9:07 AM  Result Value Ref Range Status  Specimen Description BLOOD RIGHT ARM  Final   Special Requests   Final    BOTTLES DRAWN AEROBIC AND ANAEROBIC Blood Culture adequate volume   Culture   Final    NO GROWTH 5 DAYS Performed at [facility], [address] [place], [place] [address]    Report Status 06/27/2017 FINAL  Final  Urine culture     Status: None   Collection Time: 06/22/17  9:08 AM  Result Value Ref Range Status   Specimen Description   Final    URINE, RANDOM Performed at [facility], [address], [place], [place] [address]    Special Requests   Final    NONE Performed at [facility], [address], [place], [place] [address]    Culture   Final    NO GROWTH Performed at [facility], [place] [address] [place], [place] [address]    Report Status 06/23/2017 FINAL  Final    RADIOLOGY:  No results found.  EKG:   Orders placed or performed during the hospital encounter of 06/22/17  . ED EKG  . ED EKG      Management plans discussed with the patient, family and they are in agreement.  CODE STATUS:  Code Status History    Date Active Date Inactive Code Status Order ID Comments User  Context   06/22/2017 1052 06/24/2017 1523 Full Code [id number]  Vaughan Basta, MD Inpatient      TOTAL TIME TAKING CARE OF THIS PATIENT: 35 minutes.    Vaughan Basta M.D on 06/29/2017 at 5:28 PM  Between 7am to 6pm - Pager - [phone]  After 6pm go to [email or web address] - password [email or web address] [place] [facility]  Office  [phone]  CC: Primary care physician; Pleas Koch, NP   Note: This dictation was prepared with Dragon dictation along with smaller phrase technology. Any transcriptional errors that result from this process are unintentional.

## 2017-07-06 ENCOUNTER — Encounter: Payer: Self-pay | Admitting: Primary Care

## 2017-07-06 ENCOUNTER — Ambulatory Visit: Payer: BLUE CROSS/BLUE SHIELD | Admitting: Primary Care

## 2017-07-06 VITALS — BP 120/64 | HR 58 | Temp 98.0°F | Ht 72.0 in | Wt 198.2 lb

## 2017-07-06 DIAGNOSIS — A4151 Sepsis due to Escherichia coli [E. coli]: Secondary | ICD-10-CM

## 2017-07-06 DIAGNOSIS — Z09 Encounter for follow-up examination after completed treatment for conditions other than malignant neoplasm: Secondary | ICD-10-CM

## 2017-07-06 LAB — BASIC METABOLIC PANEL
BUN: 12 mg/dL (ref 6–23)
CALCIUM: 9.2 mg/dL (ref 8.4–10.5)
CO2: 30 mEq/L (ref 19–32)
CREATININE: 1.25 mg/dL (ref 0.40–1.50)
Chloride: 104 mEq/L (ref 96–112)
GFR: 63.17 mL/min (ref >60.00)
Glucose, Bld: 97 mg/dL (ref 70–99)
Potassium: 4.3 mEq/L (ref 3.5–5.1)
Sodium: 139 mEq/L (ref 135–145)

## 2017-07-06 NOTE — Progress Notes (Signed)
Subjective:    Patient ID: Willie Good, male    DOB: 1960-02-25, 57 y.o.   MRN: [id number]  HPI  Willie Good is a 57 year old male who presents today for hospital follow up.  He presented to [facility] ED on 06/21/17 with a chief complaint of weakness, dysuria, diaphoresis, decreased urine output. He initially presented to the pharmacy who found he had fevers so they called EMS for transport.   During his stay in the ED he was treated with IV antibioitcs and fluids. Labs with normal lactic acid, mildly elevated troponin, UA not diagnostic. He underwent chest xray which was unremarkable. CT abdomen/pelvis with enlarged prostate and consistent for UTI. He underwent placement of foley catheter with removal of urine. Foley was removed later that day, patient demonstrated ability to urine on his own. Given improvement in symptoms he was discharged home with a prescription for cefdinir.  He was called several hours later that evening with reports of positive blood cultures, it was recommended he return for IV antibiotics and treatment for sepsis. He did return the following morning as instructed and was admitted for IV treatment.  He was evaluated by infectious disease. He was treated with IV Vancomycin and Cefepime, then later switched to Rocephin. He continued to improve throughout his stay. He was discharged home on 06/24/17 with a prescription for cephalexin 500 mg course.   Since discharge home he's doing better. He's back to work and is working 60 hours weekly, also doing yard work without difficulty. He's completed his cephalexin antibiotics as instructed. He is no longer using Flomax. He thinks he may have caught the bacteria from his autistic son while bathing. He has to bathe his son and often has to remove excess feces from his buttocks.   He denies fevers, chills, fatigue, weakness, difficulty urinating, dysuria.   Review of Systems  Constitutional: Negative for fatigue and fever.    Respiratory: Negative for shortness of breath.   Cardiovascular: Negative for chest pain.  Gastrointestinal: Negative for abdominal pain and nausea.  Genitourinary: Negative for difficulty urinating, dysuria and hematuria.  Neurological: Negative for dizziness and weakness.       Past Medical History:  Diagnosis Date  . Allergy   . Essential hypertension      Social History   Socioeconomic History  . Marital status: Married    Spouse name: Not on file  . Number of children: Not on file  . Years of education: Not on file  . Highest education level: Not on file  Occupational History  . Not on file  Social Needs  . Financial resource strain: Not on file  . Food insecurity:    Worry: Not on file    Inability: Not on file  . Transportation needs:    Medical: Not on file    Non-medical: Not on file  Tobacco Use  . Smoking status: Never Smoker  . Smokeless tobacco: Never Used  Substance and Sexual Activity  . Alcohol use: Yes    Alcohol/week: 0.6 oz    Types: 1 Cans of beer per week  . Drug use: No  . Sexual activity: Not Currently  Lifestyle  . Physical activity:    Days per week: Not on file    Minutes per session: Not on file  . Stress: Not on file  Relationships  . Social connections:    Talks on phone: Not on file    Gets together: Not on file    Attends  religious service: Not on file    Active member of club or organization: Not on file    Attends meetings of clubs or organizations: Not on file    Relationship status: Not on file  . Intimate partner violence:    Fear of current or ex partner: Not on file    Emotionally abused: Not on file    Physically abused: Not on file    Forced sexual activity: Not on file  Other Topics Concern  . Not on file  Social History Narrative   Married.   3 children.   Works for [employer].   Enjoys spending time with family, going to [employer], bike riding.    Past Surgical History:  Procedure Laterality Date  .  APPENDECTOMY      Family History  Problem Relation Age of Onset  . Breast cancer Mother   . Diabetes Father   . Heart disease Father   . Hyperlipidemia Father   . Hypertension Father   . Diabetes Sister   . Cancer Maternal Grandmother   . Cancer Maternal Grandfather   . Cancer Paternal Grandmother   . Diabetes Paternal Grandfather   . Heart disease Paternal Grandfather   . Hyperlipidemia Paternal Grandfather   . Hypertension Paternal Grandfather   . Stroke Paternal Grandfather     No Known Allergies  Current Outpatient Medications on File Prior to Visit  Medication Sig Dispense Refill  . acetaminophen (TYLENOL) 325 MG tablet Take 2 tablets (650 mg total) by mouth every 6 (six) hours as needed. 60 tablet 0  . aspirin EC 81 MG tablet Take 1 tablet (81 mg total) by mouth daily. 30 tablet 1  . Multiple Vitamins-Minerals (MULTIVITAMIN WITH MINERALS) tablet Take 1 tablet by mouth daily.    . Omega-3 Fatty Acids (FISH OIL PO) Take 2,000 mg by mouth daily.     . tamsulosin (FLOMAX) 0.4 MG CAPS capsule Take 1 capsule (0.4 mg total) by mouth daily. (Patient not taking: Reported on 07/06/2017) 30 capsule 0   No current facility-administered medications on file prior to visit.     BP 120/64   Pulse (!) 58   Temp 98 F (36.7 C) (Oral)   Ht 6' (1.829 m)   Wt 198 lb 4 oz (89.9 kg)   SpO2 97%   BMI 26.89 kg/m    Objective:   Physical Exam  Constitutional: He is oriented to person, place, and time. He appears well-nourished.  Neck: Neck supple.  Cardiovascular: Normal rate and regular rhythm.  Pulmonary/Chest: Effort normal and breath sounds normal.  Abdominal: Soft. There is no CVA tenderness.  Neurological: He is alert and oriented to person, place, and time.  Skin: Skin is warm and dry.          Assessment & Plan:

## 2017-07-06 NOTE — Patient Instructions (Signed)
Stop by the lab prior to leaving today. I will notify you of your results once received.   Please notify me if you develop difficulty urinating, notice blood in the urine, notice fevers.  It was a pleasure to see you today!

## 2017-07-06 NOTE — Assessment & Plan Note (Signed)
Secondary to bacteriemia from UTI.  Exam today unremarkable, appears well. Repeat BMP pending today. Continue to work on proper hydration with water. Return precautions provided.  All hospital notes, imaging, labs reviewed.

## 2017-11-22 ENCOUNTER — Ambulatory Visit: Payer: Self-pay

## 2017-11-22 ENCOUNTER — Other Ambulatory Visit: Payer: Self-pay | Admitting: Occupational Medicine

## 2017-11-22 DIAGNOSIS — M25562 Pain in left knee: Secondary | ICD-10-CM

## 2017-12-19 ENCOUNTER — Ambulatory Visit: Payer: BLUE CROSS/BLUE SHIELD | Admitting: Family Medicine

## 2017-12-19 ENCOUNTER — Encounter: Payer: Self-pay | Admitting: Family Medicine

## 2017-12-19 VITALS — BP 162/94 | HR 82 | Temp 97.8°F | Wt 212.5 lb

## 2017-12-19 DIAGNOSIS — I10 Essential (primary) hypertension: Secondary | ICD-10-CM

## 2017-12-19 DIAGNOSIS — R079 Chest pain, unspecified: Secondary | ICD-10-CM

## 2017-12-19 DIAGNOSIS — K2900 Acute gastritis without bleeding: Secondary | ICD-10-CM | POA: Diagnosis not present

## 2017-12-19 DIAGNOSIS — K921 Melena: Secondary | ICD-10-CM | POA: Diagnosis not present

## 2017-12-19 LAB — CBC WITH DIFFERENTIAL/PLATELET
Basophils Absolute: 0.1 10*3/uL (ref 0.0–0.1)
Basophils Relative: 2 % (ref 0.0–3.0)
EOS ABS: 0.6 10*3/uL (ref 0.0–0.7)
Eosinophils Relative: 9.7 % — ABNORMAL HIGH (ref 0.0–5.0)
HEMATOCRIT: 40.1 % (ref 39.0–52.0)
Hemoglobin: 13.5 g/dL (ref 13.0–17.0)
Lymphocytes Relative: 44 % (ref 12.0–46.0)
Lymphs Abs: 2.5 10*3/uL (ref 0.7–4.0)
MCHC: 33.7 g/dL (ref 30.0–36.0)
MCV: 87.1 fl (ref 78.0–100.0)
MONOS PCT: 8.3 % (ref 3.0–12.0)
Monocytes Absolute: 0.5 10*3/uL (ref 0.1–1.0)
NEUTROS ABS: 2.1 10*3/uL (ref 1.4–7.7)
NEUTROS PCT: 36 % — AB (ref 43.0–77.0)
PLATELETS: 328 10*3/uL (ref 150.0–400.0)
RBC: 4.61 Mil/uL (ref 4.22–5.81)
RDW: 13.2 % (ref 11.5–15.5)
WBC: 5.8 10*3/uL (ref 4.0–10.5)

## 2017-12-19 LAB — COMPREHENSIVE METABOLIC PANEL
ALK PHOS: 61 U/L (ref 39–117)
ALT: 15 U/L (ref 0–53)
AST: 17 U/L (ref 0–37)
Albumin: 4.1 g/dL (ref 3.5–5.2)
BILIRUBIN TOTAL: 0.3 mg/dL (ref 0.2–1.2)
BUN: 12 mg/dL (ref 6–23)
CALCIUM: 9.2 mg/dL (ref 8.4–10.5)
CO2: 28 meq/L (ref 19–32)
Chloride: 106 mEq/L (ref 96–112)
Creatinine, Ser: 1.21 mg/dL (ref 0.40–1.50)
GFR: 65.48 mL/min (ref >60.00)
GLUCOSE: 102 mg/dL — AB (ref 70–99)
POTASSIUM: 4.1 meq/L (ref 3.5–5.1)
Sodium: 139 mEq/L (ref 135–145)
Total Protein: 6.7 g/dL (ref 6.0–8.3)

## 2017-12-19 MED ORDER — LISINOPRIL 10 MG PO TABS: 10.0000 mg | ORAL_TABLET | Freq: Every day | ORAL | 1 refills | Status: DC

## 2017-12-19 MED ORDER — OMEPRAZOLE 20 MG PO CPDR: 20.0000 mg | DELAYED_RELEASE_CAPSULE | Freq: Every day | ORAL | 1 refills | Status: DC

## 2017-12-19 NOTE — Patient Instructions (Signed)
Good to see you today  I have sent in a medication called lisinopril. You can take any time of day  I have sent in a medication for your stomach- omeprazole and have put in a referral to [facility]   How to Take Your Blood Pressure You can take your blood pressure at home with a machine. You may need to check your blood pressure at home:  To check if you have high blood pressure (hypertension).  To check your blood pressure over time.  To make sure your blood pressure medicine is working.  Supplies needed: You will need a blood pressure machine, or monitor. You can buy one at a drugstore or online. When choosing one:  Choose one with an arm cuff.  Choose one that wraps around your upper arm. Only one finger should fit between your arm and the cuff.  Do not choose one that measures your blood pressure from your wrist or finger.  Your doctor can suggest a monitor. How to prepare Avoid these things for 30 minutes before checking your blood pressure:  Drinking caffeine.  Drinking alcohol.  Eating.  Smoking.  Exercising.  Five minutes before checking your blood pressure:  Pee.  Sit in a dining chair. Avoid sitting in a soft couch or armchair.  Be quiet. Do not talk.  How to take your blood pressure Follow the instructions that came with your machine. If you have a digital blood pressure monitor, these may be the instructions: 1. Sit up straight. 2. Place your feet on the floor. Do not cross your ankles or legs. 3. Rest your left arm at the level of your heart. You may rest it on a table, desk, or chair. 4. Pull up your shirt sleeve. 5. Wrap the blood pressure cuff around the upper part of your left arm. The cuff should be 1 inch (2.5 cm) above your elbow. It is best to wrap the cuff around bare skin. 6. Fit the cuff snugly around your arm. You should be able to place only one finger between the cuff and your arm. 7. Put the cord inside the groove of your elbow. 8. Press the  power button. 9. Sit quietly while the cuff fills with air and loses air. 10. Write down the numbers on the screen. 11. Wait 2-3 minutes and then repeat steps 1-10.  What do the numbers mean? Two numbers make up your blood pressure. The first number is called systolic pressure. The second is called diastolic pressure. An example of a blood pressure reading is "120 over 80" (or 120/80). If you are an adult and do not have a medical condition, use this guide to find out if your blood pressure is normal: Normal  First number: below 120.  Second number: below 80. Elevated  First number: 120-129.  Second number: below 80. Hypertension stage 1  First number: 130-139.  Second number: 80-89. Hypertension stage 2  First number: 140 or above.  Second number: 33 or above. Your blood pressure is above normal even if only the top or bottom number is above normal. Follow these instructions at home:  Check your blood pressure as often as your doctor tells you to.  Take your monitor to your next doctor's appointment. Your doctor will: ? Make sure you are using it correctly. ? Make sure it is working right.  Make sure you understand what your blood pressure numbers should be.  Tell your doctor if your medicines are causing side effects. Contact a doctor  if:  Your blood pressure keeps being high. Get help right away if:  Your first blood pressure number is higher than 180.  Your second blood pressure number is higher than 120. This information is not intended to replace advice given to you by your health care provider. Make sure you discuss any questions you have with your health care provider. Document Released: 01/22/2008 Document Revised: 01/07/2016 Document Reviewed: 07/18/2015 Elsevier Interactive Patient Education  [employer].

## 2017-12-19 NOTE — Progress Notes (Signed)
Subjective:    Patient ID: Willie Good, male    DOB: 22-Jun-1960, 57 y.o.   MRN: [id number]  HPI This is a 57 yo male who presents today as a walk in with several complaints.  He has been checking his blood pressure at home and it has been running 160-170/90-100.  He has been seen in the past by cardiology, last visit 11/11/17, and managed with diet and exercise. Had been taking ibuprofen 600 mg TID, stopped taking 3 days ago. Over last week has had diarrhea and black tarry stools x 1 (3 days ago) and has had sharp upper abdominal pain on right and left sides. Has felt pulsing in his head. No visual changes.  No weakness, no dizziness/lightheadedness. Has had irritation with eating. Took some pepto bismol with improvement and has started eating small meals. No BM for 3 days. Does not feel bloated or constipated. For a couple of days he was not eating much.    Past Medical History:  Diagnosis Date  . Allergy   . Essential hypertension    Past Surgical History:  Procedure Laterality Date  . APPENDECTOMY     Family History  Problem Relation Age of Onset  . Breast cancer Mother   . Diabetes Father   . Heart disease Father   . Hyperlipidemia Father   . Hypertension Father   . Diabetes Sister   . Cancer Maternal Grandmother   . Cancer Maternal Grandfather   . Cancer Paternal Grandmother   . Diabetes Paternal Grandfather   . Heart disease Paternal Grandfather   . Hyperlipidemia Paternal Grandfather   . Hypertension Paternal Grandfather   . Stroke Paternal Grandfather    Social History   Tobacco Use  . Smoking status: Never Smoker  . Smokeless tobacco: Never Used  Substance Use Topics  . Alcohol use: Yes    Alcohol/week: 1.0 standard drinks    Types: 1 Cans of beer per week  . Drug use: No      Review of Systems Per HPI    Objective:   Physical Exam  Constitutional: He is oriented to person, place, and time. He appears well-developed and well-nourished. No distress.   HENT:  Head: Normocephalic and atraumatic.  Eyes: Conjunctivae are normal.  Cardiovascular: Normal rate, regular rhythm and normal heart sounds.  Pulmonary/Chest: Effort normal and breath sounds normal.  Abdominal: Soft. Bowel sounds are normal. He exhibits no distension and no mass. There is no tenderness. There is no guarding.  Musculoskeletal: He exhibits no edema.  Neurological: He is alert and oriented to person, place, and time.  Skin: Skin is warm and dry. He is not diaphoretic.  Psychiatric: He has a normal mood and affect. His behavior is normal. Judgment and thought content normal.  Vitals reviewed.     BP (!) 162/94   Pulse 82   Temp 97.8 F (36.6 C)   Wt 212 lb 8 oz (96.4 kg)   BMI 28.82 kg/m  Wt Readings from Last 3 Encounters:  12/19/17 212 lb 8 oz (96.4 kg)  07/06/17 198 lb 4 oz (89.9 kg)  06/22/17 185 lb 9.6 oz (84.2 kg)   Recheck BP- 158/94 EKG- NSR rate 62, PR 132, QT 370    Assessment & Plan:  1. Chest pain, unspecified type - normal EKG, ? Referred abdominal pain, no pain currently - EKG 12-Lead  2. Complaint of melena - CBC with Differential - Comprehensive metabolic panel - Ambulatory referral to Gastroenterology  3. Essential  hypertension - provided information about checking his blood pressure and asked him to keep a log and bring in to follow up visit - CBC with Differential - Comprehensive metabolic panel  4. Acute gastritis - start omeprazole 20 mg po qd  Clarene Reamer, FNP-BC  [facility], [place]  12/19/2017 10:24 AM

## 2017-12-21 ENCOUNTER — Encounter: Payer: Self-pay | Admitting: Gastroenterology

## 2017-12-26 ENCOUNTER — Encounter: Payer: Self-pay | Admitting: Primary Care

## 2017-12-26 ENCOUNTER — Ambulatory Visit: Payer: BLUE CROSS/BLUE SHIELD | Admitting: Primary Care

## 2017-12-26 VITALS — BP 160/98 | HR 66 | Temp 98.0°F | Ht 72.0 in | Wt 214.2 lb

## 2017-12-26 DIAGNOSIS — I10 Essential (primary) hypertension: Secondary | ICD-10-CM

## 2017-12-26 DIAGNOSIS — K921 Melena: Secondary | ICD-10-CM | POA: Diagnosis not present

## 2017-12-26 DIAGNOSIS — R1012 Left upper quadrant pain: Secondary | ICD-10-CM

## 2017-12-26 MED ORDER — LISINOPRIL 10 MG PO TABS: 20.0000 mg | ORAL_TABLET | Freq: Every day | ORAL | 0 refills | Status: DC

## 2017-12-26 NOTE — Progress Notes (Signed)
Subjective:    Patient ID: Willie Good, male    DOB: Dec 18, 1960, 57 y.o.   MRN: [id number]  HPI  Willie Good is a 57 year old male who presents today for follow up. Marland Kitchen  He was last evaluated on 12/19/17 by Tor Netters for hypertension, chest pain, and melena. He was encouraged to start monitoring BP at home and bring readings to the office. He underwent ECG which was unremarkable. Labs were negative for acute or chronic anemia. He was referred to GI for further evaluation of melena.   He is managed on lisinopril 10 mg tablets daily. He's checking his blood pressure at home and is getting readings of 130-170's/90's-low 100's. He started noticing elevated blood pressure readings in early October 2019. He is compliant to his lisinopril 10 mg daily. He's also been taking 600 mg of Ibuprofen intermittently for acute left knee pain. The biggest change is his lack of regular exercise.   Wt Readings from Last 3 Encounters:  12/26/17 214 lb 4 oz (97.2 kg)  12/19/17 212 lb 8 oz (96.4 kg)  07/06/17 198 lb 4 oz (89.9 kg)     BP Readings from Last 3 Encounters:  12/26/17 (!) 160/98  12/19/17 (!) 162/94  07/06/17 120/64   He does continue to notice dark, tarry stools. Also left upper quadrant abdominal pain. He has an appointment with GI in a few weeks. He denies fatigue, bright red bloody stools, dizziness, fevers, decrease in appetite. He has been taking Ibuprofen regularly for his acute left knee pain.   Review of Systems  Constitutional: Negative for fever.  Respiratory: Negative for shortness of breath.   Cardiovascular: Negative for chest pain.  Gastrointestinal: Positive for abdominal pain and blood in stool. Negative for constipation, diarrhea, nausea and vomiting.  Neurological: Negative for dizziness.       Past Medical History:  Diagnosis Date  . Allergy   . Essential hypertension      Social History   Socioeconomic History  . Marital status: Married    Spouse name: Not  on file  . Number of children: Not on file  . Years of education: Not on file  . Highest education level: Not on file  Occupational History  . Not on file  Social Needs  . Financial resource strain: Not on file  . Food insecurity:    Worry: Not on file    Inability: Not on file  . Transportation needs:    Medical: Not on file    Non-medical: Not on file  Tobacco Use  . Smoking status: Never Smoker  . Smokeless tobacco: Never Used  Substance and Sexual Activity  . Alcohol use: Yes    Alcohol/week: 1.0 standard drinks    Types: 1 Cans of beer per week  . Drug use: No  . Sexual activity: Not Currently  Lifestyle  . Physical activity:    Days per week: Not on file    Minutes per session: Not on file  . Stress: Not on file  Relationships  . Social connections:    Talks on phone: Not on file    Gets together: Not on file    Attends religious service: Not on file    Active member of club or organization: Not on file    Attends meetings of clubs or organizations: Not on file    Relationship status: Not on file  . Intimate partner violence:    Fear of current or ex partner: Not on file  Emotionally abused: Not on file    Physically abused: Not on file    Forced sexual activity: Not on file  Other Topics Concern  . Not on file  Social History Narrative   Married.   3 children.   Works for [employer].   Enjoys spending time with family, going to [employer], bike riding.    Past Surgical History:  Procedure Laterality Date  . APPENDECTOMY      Family History  Problem Relation Age of Onset  . Breast cancer Mother   . Diabetes Father   . Heart disease Father   . Hyperlipidemia Father   . Hypertension Father   . Diabetes Sister   . Cancer Maternal Grandmother   . Cancer Maternal Grandfather   . Cancer Paternal Grandmother   . Diabetes Paternal Grandfather   . Heart disease Paternal Grandfather   . Hyperlipidemia Paternal Grandfather   . Hypertension Paternal  Grandfather   . Stroke Paternal Grandfather     No Known Allergies  Current Outpatient Medications on File Prior to Visit  Medication Sig Dispense Refill  . lisinopril (PRINIVIL,ZESTRIL) 10 MG tablet Take 1 tablet (10 mg total) by mouth daily. 90 tablet 1  . Multiple Vitamins-Minerals (MULTIVITAMIN WITH MINERALS) tablet Take 1 tablet by mouth daily.    . Omega-3 Fatty Acids (FISH OIL PO) Take 2,000 mg by mouth daily.     Marland Kitchen omeprazole (PRILOSEC) 20 MG capsule Take 1 capsule (20 mg total) by mouth daily. 30 capsule 1  . acetaminophen (TYLENOL) 325 MG tablet Take 2 tablets (650 mg total) by mouth every 6 (six) hours as needed. (Patient not taking: Reported on 12/19/2017) 60 tablet 0  . tamsulosin (FLOMAX) 0.4 MG CAPS capsule Take 1 capsule (0.4 mg total) by mouth daily. (Patient not taking: Reported on 07/06/2017) 30 capsule 0   No current facility-administered medications on file prior to visit.     BP (!) 160/98   Pulse 66   Temp 98 F (36.7 C) (Oral)   Ht 6' (1.829 m)   Wt 214 lb 4 oz (97.2 kg)   SpO2 98%   BMI 29.06 kg/m    Objective:   Physical Exam  Constitutional: He appears well-nourished.  Neck: Neck supple.  Cardiovascular: Normal rate and regular rhythm.  Respiratory: Effort normal and breath sounds normal.  GI: Soft. Bowel sounds are normal. There is no tenderness.  Skin: Skin is warm and dry.           Assessment & Plan:

## 2017-12-26 NOTE — Assessment & Plan Note (Addendum)
Also with melena. Could be NSAID induced ulcer vs H pylori. Labs from last visit reviewed and are stable. He has an appointment with GI in a few weeks. Exam stable, he does not appear sickly and is not in acute distress. He is compliant to omeprazole daily, unable to check H pylori stool testing today. Await GI appointment.

## 2017-12-26 NOTE — Patient Instructions (Signed)
We've increased the dose of your lisinopril to 20 mg. You may take two of the 10 mg tablets to equal 20 mg for now.   Continue to monitor your blood pressure once daily as discussed.  Stop by the lab prior to leaving today. I will notify you of your results once received.   Schedule a follow up visit in 2 weeks for blood pressure check.  It was a pleasure to see you today!

## 2017-12-26 NOTE — Assessment & Plan Note (Signed)
Uncontrolled over the last month. Could be secondary to knee pain vs LUQ abdominal pain/melena.   Increase lisinopril to 20 mg, will have him take two 10 mg tablets for now as he recently received a 90 tablet refill. He will monitor BP daily and follow up in 2 weeks for BP check.

## 2018-01-03 ENCOUNTER — Encounter: Payer: Self-pay | Admitting: *Deleted

## 2018-01-09 ENCOUNTER — Encounter: Payer: Self-pay | Admitting: Physician Assistant

## 2018-01-09 ENCOUNTER — Ambulatory Visit (INDEPENDENT_AMBULATORY_CARE_PROVIDER_SITE_OTHER): Payer: BLUE CROSS/BLUE SHIELD | Admitting: Primary Care

## 2018-01-09 ENCOUNTER — Encounter: Payer: Self-pay | Admitting: Primary Care

## 2018-01-09 ENCOUNTER — Ambulatory Visit: Payer: BLUE CROSS/BLUE SHIELD | Admitting: Physician Assistant

## 2018-01-09 VITALS — BP 144/80 | HR 85 | Wt 214.0 lb

## 2018-01-09 DIAGNOSIS — Z1211 Encounter for screening for malignant neoplasm of colon: Secondary | ICD-10-CM | POA: Diagnosis not present

## 2018-01-09 DIAGNOSIS — K921 Melena: Secondary | ICD-10-CM

## 2018-01-09 DIAGNOSIS — R1013 Epigastric pain: Secondary | ICD-10-CM

## 2018-01-09 DIAGNOSIS — Z1212 Encounter for screening for malignant neoplasm of rectum: Secondary | ICD-10-CM

## 2018-01-09 DIAGNOSIS — I10 Essential (primary) hypertension: Secondary | ICD-10-CM | POA: Diagnosis not present

## 2018-01-09 MED ORDER — NA SULFATE-K SULFATE-MG SULF 17.5-3.13-1.6 GM/177ML PO SOLN: ORAL | 0 refills | Status: DC

## 2018-01-09 MED ORDER — LISINOPRIL-HYDROCHLOROTHIAZIDE 20-12.5 MG PO TABS: 1.0000 | ORAL_TABLET | Freq: Every day | ORAL | 0 refills | Status: DC

## 2018-01-09 NOTE — Assessment & Plan Note (Signed)
Improved but not yet at goal. Change from lisinopril 20 mg to lisinopril-HCTZ 20-12.5 mg. Follow up in 2-3 weeks for BP check and BMP.

## 2018-01-09 NOTE — Progress Notes (Signed)
Chief Complaint: Epigastric pain, melena, screening for colorectal cancer  HPI:    Willie Good is a 57 year old male with a past medical history as listed below, who was referred to me by Willie Beck, FNP for a complaint of epigastric pain, melena and screening for colorectal cancer.      CBC and CMP 12/19/2017 was normal.    Today, the patient tells me that about a month ago or so he developed a knee injury and was told to start taking 4 Ibuprofen 3 times a day, he noticed that his blood pressure became elevated and at one point he even developed an epigastric/chest pain, he went to see his PCP who started him on a blood pressure medicine which "I have never needed in my life".  At that time, he also described some dark stools and even a day of black/tarry stool.  There was concern over a GI bleed and patient had a CBC which was normal as above.  He was told to start Prilosec which he took for a few days.  Over the past 2 weeks he has noticed no further black stools, in fact they have returned to completely normal.  Does continue his blood pressure medicine but tells me his blood pressure is under control now too.  The only symptom he continues with this is a "rawness" when he eats anything.  Tells me he feels it to go down his throat and it is somewhat uncomfortable.  Denies heartburn or reflux.    Social history positive for being on a fasting diet, he typically only eats one meal a day consisting of 1200 cal and has done this for the past 6 years.  He is typically a big exerciser as his father died at a younger age due to obesity and heart problems.    Denies fever, chills, weight loss, anorexia, nausea, vomiting or symptoms that awaken him from sleep.  Past Medical History:  Diagnosis Date  . Allergy   . Essential hypertension     Past Surgical History:  Procedure Laterality Date  . APPENDECTOMY      Current Outpatient Medications  Medication Sig Dispense Refill  . lisinopril  (PRINIVIL,ZESTRIL) 10 MG tablet Take 2 tablets (20 mg total) by mouth daily. 180 tablet 0  . Multiple Vitamins-Minerals (MULTIVITAMIN WITH MINERALS) tablet Take 1 tablet by mouth daily.    . Omega-3 Fatty Acids (FISH OIL PO) Take 2,000 mg by mouth daily.     Marland Kitchen acetaminophen (TYLENOL) 325 MG tablet Take 2 tablets (650 mg total) by mouth every 6 (six) hours as needed. (Patient not taking: Reported on 12/19/2017) 60 tablet 0  . omeprazole (PRILOSEC) 20 MG capsule Take 1 capsule (20 mg total) by mouth daily. (Patient not taking: Reported on 01/09/2018) 30 capsule 1  . tamsulosin (FLOMAX) 0.4 MG CAPS capsule Take 1 capsule (0.4 mg total) by mouth daily. (Patient not taking: Reported on 07/06/2017) 30 capsule 0   No current facility-administered medications for this visit.     Allergies as of 01/09/2018  . (No Known Allergies)    Family History  Problem Relation Age of Onset  . Breast cancer Mother   . Diabetes Father   . Heart disease Father   . Hyperlipidemia Father   . Hypertension Father   . Diabetes Sister   . Cancer Maternal Grandmother   . Cancer Maternal Grandfather   . Cancer Paternal Grandmother   . Diabetes Paternal Grandfather   . Heart disease Paternal  Grandfather   . Hyperlipidemia Paternal Grandfather   . Hypertension Paternal Grandfather   . Stroke Paternal Grandfather     Social History   Socioeconomic History  . Marital status: Married    Spouse name: Not on file  . Number of children: Not on file  . Years of education: Not on file  . Highest education level: Not on file  Occupational History  . 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 Financial resource strain: Not on file  . Food insecurity:    Worry: Not on file    Inability: Not on file  . Transportation needs:    Medical: Not on file    Non-medical: Not on file  Tobacco Use  . Smoking status: Never Smoker  . Smokeless tobacco: Never Used  Substance and Sexual Activity  . Alcohol use: Yes     Alcohol/week: 1.0 standard drinks    Types: 1 Cans of beer per week  . Drug use: No  . Sexual activity: Not Currently  Lifestyle  . Physical activity:    Days per week: Not on file    Minutes per session: Not on file  . Stress: Not on file  Relationships  . Social connections:    Talks on phone: Not on file    Gets together: Not on file    Attends religious service: Not on file    Active member of club or organization: Not on file    Attends meetings of clubs or organizations: Not on file    Relationship status: Not on file  . Intimate partner violence:    Fear of current or ex partner: Not on file    Emotionally abused: Not on file    Physically abused: Not on file    Forced sexual activity: Not on file  Other Topics Concern  . Not on file  Social History Narrative   Married.   3 children.   Works for [employer].   Enjoys spending time with family, going to [employer], bike riding.    Review of Systems:    Constitutional: No weight loss, fever or chills Skin: No rash  Cardiovascular: No chest pressure Respiratory: No SOB Gastrointestinal: See HPI and otherwise negative Genitourinary: No dysuria Neurological: No headache Musculoskeletal: No new muscle or joint pain Hematologic: No bruising Psychiatric: No history of depression or anxiety   Physical Exam:  Vital signs: BP (!) 144/80   Pulse 85   Wt 214 lb (97.1 kg)   BMI 29.02 kg/m   Constitutional:   Pleasant Caucasian male appears to be in NAD, Well developed, Well nourished, alert and cooperative Head:  Normocephalic and atraumatic. Eyes:   PEERL, EOMI. No icterus. Conjunctiva pink. Ears:  Normal auditory acuity. Neck:  Supple Throat: Oral cavity and pharynx without inflammation, swelling or lesion.  Respiratory: Respirations even and unlabored. Lungs clear to auscultation bilaterally.   No wheezes, crackles, or rhonchi.  Cardiovascular: Normal S1, S2. No MRG. Regular rate and rhythm. No peripheral edema, cyanosis  or pallor.  Gastrointestinal:  Soft, nondistended, mild epigastric ttp, No rebound or guarding. Normal bowel sounds. No appreciable masses or hepatomegaly. Msk:  Symmetrical without gross deformities. Without edema, no deformity or joint abnormality.  Neurologic:  Alert and  oriented x4;  grossly normal neurologically.  Skin:   Dry and intact without significant lesions or rashes. Psychiatric: Demonstrates good judgement and reason without abnormal affect or behaviors.  RELEVANT LABS AND IMAGING: CBC    Component Value Date/Time  WBC 5.8 12/19/2017 0907   RBC 4.61 12/19/2017 0907   HGB 13.5 12/19/2017 0907   HCT 40.1 12/19/2017 0907   PLT 328.0 12/19/2017 0907   MCV 87.1 12/19/2017 0907   MCV 87.0 04/13/2015 1159   MCH 29.7 06/23/2017 0534   MCHC 33.7 12/19/2017 0907   RDW 13.2 12/19/2017 0907   LYMPHSABS 2.5 12/19/2017 0907   MONOABS 0.5 12/19/2017 0907   EOSABS 0.6 12/19/2017 0907   BASOSABS 0.1 12/19/2017 0907    CMP     Component Value Date/Time   NA 139 12/19/2017 0907   K 4.1 12/19/2017 0907   CL 106 12/19/2017 0907   CO2 28 12/19/2017 0907   GLUCOSE 102 (H) 12/19/2017 0907   BUN 12 12/19/2017 0907   CREATININE 1.21 12/19/2017 0907   CREATININE 1.31 04/05/2015 0909   CALCIUM 9.2 12/19/2017 0907   PROT 6.7 12/19/2017 0907   ALBUMIN 4.1 12/19/2017 0907   AST 17 12/19/2017 0907   ALT 15 12/19/2017 0907   ALKPHOS 61 12/19/2017 0907   BILITOT 0.3 12/19/2017 0907   GFRNONAA >60 06/23/2017 0534   GFRNONAA 61 04/05/2015 0909   GFRAA >60 06/23/2017 0534   GFRAA 70 04/05/2015 0909    Assessment: 1.  Epigastric pain: Started after using Ibuprofen 4 tabs 3 times a day for a knee injury as directed by his physician; consider most likely PUD versus gastritis versus other 2.  Melena: History of 1 to 2 days of tarry stool about 2 weeks ago, normal CBC, none since, likely related to NSAID use 3.  Screening for colorectal cancer: Last colonoscopy in early 2010, he does  report polyps, though unsure what kind, patient is due for repeat in January  Plan: 1.  Scheduled patient for an EGD and colonoscopy with Dr. Havery Moros in [facility].  Did discuss risk, benefits, limitations and alternatives and the patient agrees to proceed. 2.  Reviewed antireflux diet and lifestyle modifications. 3.  Discussed patient's fasting diet, I have no problems with this at this time. 4.  Patient will follow in clinic per recommendations from Dr. Havery Moros after time of procedures. 5.  Did request records from [facility] as it sounds as though this is where patient had his colonoscopy 10 years ago.  Ellouise Newer, PA-C [facility] 01/09/2018, 8:43 AM  Cc: Willie Beck, FNP

## 2018-01-09 NOTE — Patient Instructions (Addendum)
If you are age 58 or older, your body mass index should be between 23-30. Your There is no height or weight on file to calculate BMI. If this is out of the aforementioned range listed, please consider follow up with your Primary Care Provider.  If you are age 34 or younger, your body mass index should be between 19-25. Your There is no height or weight on file to calculate BMI. If this is out of the aformentioned range listed, please consider follow up with your Primary Care Provider.   You have been scheduled for an endoscopy and colonoscopy. Please follow the written instructions given to you at your visit today. Please pick up your prep supplies at the pharmacy TODAY. If you use inhalers (even only as needed), please bring them with you on the day of your procedure. Your physician has requested that you go to www.startemmi.com and enter the access code given to you at your visit today. This web site gives a general overview about your procedure. However, you should still follow specific instructions given to you by our office regarding your preparation for the procedure.  We have sent the following medications to your pharmacy for you to pick up at your convenience: Suprep  Thank you for choosing me and [facility].   Ellouise Newer, PA-C

## 2018-01-09 NOTE — Progress Notes (Signed)
Agree with assessment and plan as outlined.  

## 2018-01-09 NOTE — Patient Instructions (Signed)
Stop lisinopril. Start lisinopril-hydrochlorothiazide 20-12.5 mg. Take 1 tablet by mouth once daily.  Continue to monitor your blood pressure at home.   Schedule a follow up visit in 3 weeks for blood pressure check.  It was a pleasure to see you today!

## 2018-01-09 NOTE — Progress Notes (Signed)
Pre visit review using our clinic review tool, if applicable. No additional management support is needed unless otherwise documented below in the visit note. 

## 2018-01-09 NOTE — Progress Notes (Signed)
Subjective:    Patient ID: Willie Good, male    DOB: [birth year], 57 y.o.   MRN: [id number]  HPI  Mr. Bruso is a 57 year old male who presents today for follow up of hypertension.  He was last evaluated on 12/26/17 for hypertension. Blood pressure remained elevated with home and office readings so his lisinopril was increased to 20 mg.   BP Readings from Last 3 Encounters:  01/09/18 (!) 140/92  01/09/18 (!) 144/80  12/26/17 (!) 160/98   Since his last visit he's checking his blood pressure at home which is running 120-140's with some 160's/70's-90's. He denies headaches, dizziness. He's started exercising and is working to change his diet.   Review of Systems  Respiratory: Negative for shortness of breath.   Cardiovascular: Negative for chest pain.  Neurological: Negative for dizziness and headaches.       Past Medical History:  Diagnosis Date  . Allergy   . Essential hypertension      Social History   Socioeconomic History  . Marital status: Married    Spouse name: Not on file  . Number of children: Not on file  . Years of education: Not on file  . Highest education level: Not on file  Occupational History  . 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 Financial resource strain: Not on file  . Food insecurity:    Worry: Not on file    Inability: Not on file  . Transportation needs:    Medical: Not on file    Non-medical: Not on file  Tobacco Use  . Smoking status: Never Smoker  . Smokeless tobacco: Never Used  Substance and Sexual Activity  . Alcohol use: Yes    Alcohol/week: 1.0 standard drinks    Types: 1 Cans of beer per week  . Drug use: No  . Sexual activity: Not Currently  Lifestyle  . Physical activity:    Days per week: Not on file    Minutes per session: Not on file  . Stress: Not on file  Relationships  . Social connections:    Talks on phone: Not on file    Gets together: Not on file    Attends religious service: Not on file    Active  member of club or organization: Not on file    Attends meetings of clubs or organizations: Not on file    Relationship status: Not on file  . Intimate partner violence:    Fear of current or ex partner: Not on file    Emotionally abused: Not on file    Physically abused: Not on file    Forced sexual activity: Not on file  Other Topics Concern  . Not on file  Social History Narrative   Married.   3 children.   Works for [employer].   Enjoys spending time with family, going to [employer], bike riding.    Past Surgical History:  Procedure Laterality Date  . APPENDECTOMY      Family History  Problem Relation Age of Onset  . Breast cancer Mother   . Diabetes Father   . Heart disease Father   . Hyperlipidemia Father   . Hypertension Father   . Diabetes Sister   . Cancer Maternal Grandmother   . Cancer Maternal Grandfather   . Cancer Paternal Grandmother   . Diabetes Paternal Grandfather   . Heart disease Paternal Grandfather   . Hyperlipidemia Paternal Grandfather   . Hypertension Paternal Grandfather   .  Stroke Paternal Grandfather     No Known Allergies  Current Outpatient Medications on File Prior to Visit  Medication Sig Dispense Refill  . acetaminophen (TYLENOL) 325 MG tablet Take 2 tablets (650 mg total) by mouth every 6 (six) hours as needed. 60 tablet 0  . lisinopril (PRINIVIL,ZESTRIL) 10 MG tablet Take 2 tablets (20 mg total) by mouth daily. 180 tablet 0  . Multiple Vitamins-Minerals (MULTIVITAMIN WITH MINERALS) tablet Take 1 tablet by mouth daily.    . Na Sulfate-K Sulfate-Mg Sulf 17.5-3.13-1.6 GM/177ML SOLN Suprep-Use as directed 354 mL 0  . Omega-3 Fatty Acids (FISH OIL PO) Take 2,000 mg by mouth daily.     Marland Kitchen omeprazole (PRILOSEC) 20 MG capsule Take 1 capsule (20 mg total) by mouth daily. 30 capsule 1  . tamsulosin (FLOMAX) 0.4 MG CAPS capsule Take 1 capsule (0.4 mg total) by mouth daily. 30 capsule 0   No current facility-administered medications on file prior to  visit.     BP (!) 140/92   Pulse 76   Temp (!) 97.5 F (36.4 C) (Oral)   Ht 6' (1.829 m)   Wt 211 lb (95.7 kg)   SpO2 95%   BMI 28.62 kg/m    Objective:   Physical Exam  Constitutional: He appears well-nourished.  Neck: Neck supple.  Cardiovascular: Normal rate and regular rhythm.  Respiratory: Effort normal and breath sounds normal.  Skin: Skin is warm and dry.           Assessment & Plan:

## 2018-01-10 ENCOUNTER — Ambulatory Visit (AMBULATORY_SURGERY_CENTER): Payer: BLUE CROSS/BLUE SHIELD | Admitting: Gastroenterology

## 2018-01-10 ENCOUNTER — Other Ambulatory Visit: Payer: Self-pay | Admitting: Family Medicine

## 2018-01-10 ENCOUNTER — Encounter: Payer: Self-pay | Admitting: Gastroenterology

## 2018-01-10 VITALS — BP 165/75 | HR 64 | Temp 98.7°F | Resp 9 | Ht 72.0 in | Wt 211.0 lb

## 2018-01-10 DIAGNOSIS — K219 Gastro-esophageal reflux disease without esophagitis: Secondary | ICD-10-CM

## 2018-01-10 DIAGNOSIS — K2970 Gastritis, unspecified, without bleeding: Secondary | ICD-10-CM | POA: Diagnosis not present

## 2018-01-10 DIAGNOSIS — Z1211 Encounter for screening for malignant neoplasm of colon: Secondary | ICD-10-CM | POA: Diagnosis present

## 2018-01-10 DIAGNOSIS — R1013 Epigastric pain: Secondary | ICD-10-CM

## 2018-01-10 DIAGNOSIS — K635 Polyp of colon: Secondary | ICD-10-CM

## 2018-01-10 DIAGNOSIS — K3189 Other diseases of stomach and duodenum: Secondary | ICD-10-CM

## 2018-01-10 DIAGNOSIS — K2900 Acute gastritis without bleeding: Secondary | ICD-10-CM

## 2018-01-10 DIAGNOSIS — Z1212 Encounter for screening for malignant neoplasm of rectum: Secondary | ICD-10-CM

## 2018-01-10 DIAGNOSIS — D125 Benign neoplasm of sigmoid colon: Secondary | ICD-10-CM

## 2018-01-10 MED ORDER — SODIUM CHLORIDE 0.9 % IV SOLN: 500.0000 mL | Freq: Once | INTRAVENOUS | Status: DC

## 2018-01-10 NOTE — Progress Notes (Signed)
Called to room to assist during endoscopic procedure.  Patient ID and intended procedure confirmed with present staff. Received instructions for my participation in the procedure from the performing physician.  

## 2018-01-10 NOTE — Telephone Encounter (Signed)
[facility] patient.

## 2018-01-10 NOTE — Op Note (Addendum)
[place] Patient Name: Willie Good Procedure Date: 01/10/2018 3:15 PM MRN: [id number] Endoscopist: Remo Lipps P. Havery Moros , MD Age: 57 Referring MD:  Date of Birth: 01/14/1961 Gender: Male Account #: [id number] Procedure:                Colonoscopy Indications:              Screening for colorectal malignant neoplasm Medicines:                Monitored Anesthesia Care Procedure:                Pre-Anesthesia Assessment:                           - Prior to the procedure, a History and Physical                            was performed, and patient medications and                            allergies were reviewed. The patient's tolerance of                            previous anesthesia was also reviewed. The risks                            and benefits of the procedure and the sedation                            options and risks were discussed with the patient.                            All questions were answered, and informed consent                            was obtained. Prior Anticoagulants: The patient has                            taken no previous anticoagulant or antiplatelet                            agents. ASA Grade Assessment: II - A patient with                            mild systemic disease. After reviewing the risks                            and benefits, the patient was deemed in                            satisfactory condition to undergo the procedure.                           After obtaining informed consent, the colonoscope  was passed under direct vision. Throughout the                            procedure, the patient's blood pressure, pulse, and                            oxygen saturations were monitored continuously. The                            Colonoscope was introduced through the anus and                            advanced to the the terminal ileum, with                            identification of the  appendiceal orifice and IC                            valve. The colonoscopy was performed without                            difficulty. The patient tolerated the procedure                            well. The quality of the bowel preparation was                            good. The terminal ileum, ileocecal valve,                            appendiceal orifice, and rectum were photographed. Scope In: 3:16:36 PM Scope Out: 3:35:21 PM Scope Withdrawal Time: 0 hours 16 minutes 27 seconds  Total Procedure Duration: 0 hours 18 minutes 45 seconds  Findings:                 Skin tags were found on perianal exam.                           The terminal ileum appeared normal.                           A 3 mm polyp was found in the sigmoid colon. The                            polyp was sessile. The polyp was removed with a                            cold snare. Resection and retrieval were complete.                           Internal hemorrhoids were found during                            retroflexion. The hemorrhoids were small.  Anal papilla(e) were hypertrophied.                           The exam was otherwise without abnormality. Complications:            No immediate complications. Estimated blood loss:                            Minimal. Estimated Blood Loss:     Estimated blood loss was minimal. Impression:               - Perianal skin tags found on perianal exam.                           - The examined portion of the ileum was normal.                           - One 3 mm polyp in the sigmoid colon, removed with                            a cold snare. Resected and retrieved.                           - Internal hemorrhoids.                           - Anal papilla(e) were hypertrophied.                           - The examination was otherwise normal. Recommendation:           - Patient has a contact number available for                             emergencies. The signs and symptoms of potential                            delayed complications were discussed with the                            patient. Return to normal activities tomorrow.                            Written discharge instructions were provided to the                            patient.                           - Resume previous diet.                           - Continue present medications.                           - Await pathology results. Remo Lipps P. Armbruster, MD 01/10/2018 3:41:44 PM This report has been signed electronically.

## 2018-01-10 NOTE — Op Note (Addendum)
[place] [place] Patient Name: Willie Good Procedure Date: 01/10/2018 3:06 PM MRN: [id number] Endoscopist: Remo Lipps P. Havery Moros , MD Age: 57 Referring MD:  Date of Birth: Aug 14, 1960 Gender: Male Account #: [id number] Procedure:                Upper GI endoscopy Indications:              Epigastric abdominal pain, Melena, history of NSAID                            use Medicines:                Monitored Anesthesia Care Procedure:                Pre-Anesthesia Assessment:                           - Prior to the procedure, a History and Physical                            was performed, and patient medications and                            allergies were reviewed. The patient's tolerance of                            previous anesthesia was also reviewed. The risks                            and benefits of the procedure and the sedation                            options and risks were discussed with the patient.                            All questions were answered, and informed consent                            was obtained. Prior Anticoagulants: The patient has                            taken no previous anticoagulant or antiplatelet                            agents. ASA Grade Assessment: II - A patient with                            mild systemic disease. After reviewing the risks                            and benefits, the patient was deemed in                            satisfactory condition to undergo the procedure.  After obtaining informed consent, the endoscope was                            passed under direct vision. Throughout the                            procedure, the patient's blood pressure, pulse, and                            oxygen saturations were monitored continuously. The                            Endoscope was introduced through the mouth, and                            advanced to the second part of duodenum. The  upper                            GI endoscopy was accomplished without difficulty.                            The patient tolerated the procedure well. Scope In: Scope Out: Findings:                 Esophagogastric landmarks were identified: the                            Z-line was found at 40 cm, the gastroesophageal                            junction was found at 40 cm and the upper extent of                            the gastric folds was found at 40 cm from the                            incisors.                           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                           LA Grade A esophagitis was found at the GEJ.                           The exam of the esophagus was otherwise normal.                           Patchy inflammation characterized by erosions,  erythema and friability was found in the gastric                            body and in the gastric antrum. Biopsies were taken                            from antrum, body, and incisura with a cold forceps                            for Helicobacter pylori testing.                           The exam of the stomach was otherwise normal.                           The duodenal bulb and second portion of the                            duodenum were normal. Complications:            No immediate complications. Estimated blood loss:                            Minimal. Estimated Blood Loss:     Estimated blood loss was minimal. Impression:               - Esophagogastric landmarks identified.                           - Ectopic gastric mucosa in the upper third of the                            esophagus.                           - LA Grade A reflux esophagitis.                           - Gastritis. Biopsied to rule out H pylori.                           - Normal duodenal bulb and second portion of the                             duodenum.                           Suspect symptoms are due to gastritis noted on this                            exam (patient has stopped NSAIDs a few weeks ago,                            suspect it was likely worse previously) Recommendation:           - Patient has a contact  number available for                            emergencies. The signs and symptoms of potential                            delayed complications were discussed with the                            patient. Return to normal activities tomorrow.                            Written discharge instructions were provided to the                            patient.                           - Resume previous diet.                           - Continue present medications.                           - Start PPI (patient had intolerance to omeprazole?                            would try pantoprazole 40mg  once daily)                           - Await pathology results.                           - Avoid NSAIDs Carlota Raspberry. Armbruster, MD 01/10/2018 3:48:05 PM This report has been signed electronically.

## 2018-01-10 NOTE — Telephone Encounter (Signed)
Last prescribed on 12/19/2017  Last office visit on 01/09/2018

## 2018-01-10 NOTE — Progress Notes (Signed)
No problems noted in the recovery room. maw 

## 2018-01-10 NOTE — Progress Notes (Signed)
Pt's states no medical or surgical changes since previsit or office visit. 

## 2018-01-10 NOTE — Progress Notes (Signed)
Report given to PACU, vss 

## 2018-01-10 NOTE — Progress Notes (Signed)
Per Dr. Havery Moros do not send in RX for PANTOPRAZOLE.  After speaking with pt, pt will restart the OMEPRAZOLE that he already has at home.  maw

## 2018-01-10 NOTE — Patient Instructions (Addendum)
YOU HAD AN ENDOSCOPIC PROCEDURE TODAY AT [facility]:   Refer to the procedure report that was given to you for any specific questions about what was found during the examination.  If the procedure report does not answer your questions, please call your gastroenterologist to clarify.  If you requested that your care partner not be given the details of your procedure findings, then the procedure report has been included in a sealed envelope for you to review at your convenience later.  YOU SHOULD EXPECT: Some feelings of bloating in the abdomen. Passage of more gas than usual.  Walking can help get rid of the air that was put into your GI tract during the procedure and reduce the bloating. If you had a lower endoscopy (such as a colonoscopy or flexible sigmoidoscopy) you may notice spotting of blood in your stool or on the toilet paper. If you underwent a bowel prep for your procedure, you may not have a normal bowel movement for a few days.  Please Note:  You might notice some irritation and congestion in your nose or some drainage.  This is from the oxygen used during your procedure.  There is no need for concern and it should clear up in a day or so.  SYMPTOMS TO REPORT IMMEDIATELY:   Following lower endoscopy (colonoscopy or flexible sigmoidoscopy):  Excessive amounts of blood in the stool  Significant tenderness or worsening of abdominal pains  Swelling of the abdomen that is new, acute  Fever of 100F or higher   Following upper endoscopy (EGD)  Vomiting of blood or coffee ground material  New chest pain or pain under the shoulder blades  Painful or persistently difficult swallowing  New shortness of breath  Fever of 100F or higher  Black, tarry-looking stools  For urgent or emergent issues, a gastroenterologist can be reached at any hour by calling [phone].   DIET:  We do recommend a small meal at first, but then you may proceed to your regular diet.  Drink  plenty of fluids but you should avoid alcoholic beverages for 24 hours.  ACTIVITY:  You should plan to take it easy for the rest of today and you should NOT DRIVE or use heavy machinery until tomorrow (because of the sedation medicines used during the test).    FOLLOW UP: Our staff will call the number listed on your records the next business day following your procedure to check on you and address any questions or concerns that you may have regarding the information given to you following your procedure. If we do not reach you, we will leave a message.  However, if you are feeling well and you are not experiencing any problems, there is no need to return our call.  We will assume that you have returned to your regular daily activities without incident.  If any biopsies were taken you will be contacted by phone or by letter within the next 1-3 weeks.  Please call us at [phone] if you have not heard about the biopsies in 3 weeks.    SIGNATURES/CONFIDENTIALITY: You and/or your care partner have signed paperwork which will be entered into your electronic medical record.  These signatures attest to the fact that that the information above on your After Visit Summary has been reviewed and is understood.  Full responsibility of the confidentiality of this discharge information lies with you and/or your care-partner.    Handouts were given to your care partner on polyps,  hemorrhoids, and gastritis. Start OMEPRAZOLE that you already have at home per Dr. Havery Moros. AVOID NSAIDs. You may resume your other current medications today. Await biopsy results. Please call if any questions or concerns.

## 2018-01-10 NOTE — Progress Notes (Signed)
Pt asked if ok to continue taking tumeric/garlic powder that are mixed together.  Per Dr. Efrain Sella can cause stomach upset.  To hold off a few weeks then restart.  Pt is aware.  maw

## 2018-01-10 NOTE — Telephone Encounter (Signed)
Noted, refill sent to pharmacy. 

## 2018-01-11 ENCOUNTER — Telehealth: Payer: Self-pay | Admitting: *Deleted

## 2018-01-11 NOTE — Telephone Encounter (Signed)
  Follow up Call-  Call back number 01/10/2018  Post procedure Call Back phone  # [phone]  Permission to leave phone message Yes  Some recent data might be hidden     Patient questions:  Do you have a fever, pain , or abdominal swelling? No. Pain Score  0 *  Have you tolerated food without any problems? Yes.    Have you been able to return to your normal activities? Yes.    Do you have any questions about your discharge instructions: Diet   No. Medications  No. Follow up visit  No.  Do you have questions or concerns about your Care? No.  Actions: * If pain score is 4 or above: No action needed, pain <4. Jerilee Field RN,spoke with pt. Regarding resuming omeprazole vs. Pantoprazol per. Dr. Doyne Keel directions yesterday.

## 2018-01-17 ENCOUNTER — Telehealth: Payer: Self-pay | Admitting: Gastroenterology

## 2018-01-17 NOTE — Telephone Encounter (Signed)
Spoke with pt and he is now able to see all of the results. He knows polyp was benign and recall in 10 years.

## 2018-01-17 NOTE — Telephone Encounter (Signed)
Patient is confused about results given to him via mychart. Patient wants to know if the polyp was pre cancerous or not and when is he due for his recall. Please advise pt with mychart or mobile phone.

## 2018-01-27 DIAGNOSIS — I10 Essential (primary) hypertension: Secondary | ICD-10-CM

## 2018-01-27 MED ORDER — LISINOPRIL-HYDROCHLOROTHIAZIDE 20-12.5 MG PO TABS: 1.0000 | ORAL_TABLET | Freq: Every day | ORAL | 3 refills | Status: DC

## 2018-01-30 ENCOUNTER — Ambulatory Visit: Payer: Self-pay | Admitting: Gastroenterology

## 2018-01-30 ENCOUNTER — Ambulatory Visit: Payer: Self-pay | Admitting: Primary Care

## 2018-02-27 ENCOUNTER — Ambulatory Visit: Payer: BLUE CROSS/BLUE SHIELD | Admitting: Primary Care

## 2018-02-27 ENCOUNTER — Encounter: Payer: Self-pay | Admitting: Primary Care

## 2018-02-27 VITALS — BP 126/84 | HR 74 | Temp 97.7°F | Ht 72.0 in | Wt 215.0 lb

## 2018-02-27 DIAGNOSIS — I10 Essential (primary) hypertension: Secondary | ICD-10-CM

## 2018-02-27 LAB — BASIC METABOLIC PANEL
BUN: 14 mg/dL (ref 6–23)
CO2: 31 mEq/L (ref 19–32)
Calcium: 9.5 mg/dL (ref 8.4–10.5)
Chloride: 101 mEq/L (ref 96–112)
Creatinine, Ser: 1.22 mg/dL (ref 0.40–1.50)
GFR: 64.82 mL/min (ref >60.00)
GLUCOSE: 89 mg/dL (ref 70–99)
POTASSIUM: 4 meq/L (ref 3.5–5.1)
SODIUM: 138 meq/L (ref 135–145)

## 2018-02-27 NOTE — Assessment & Plan Note (Signed)
Improved on lisinopril-HCTZ 20/12.5, continue same. BMP pending.

## 2018-02-27 NOTE — Progress Notes (Signed)
Subjective:    Patient ID: Willie Good, male    DOB: 1960-09-18, 59 y.o.   MRN: [id number]  HPI  Willie Good is a 58 year old male who presents today for follow up of hypertension.  He was last evaluated on 01/09/18 for follow up of hypertension. He was noted to have elevated blood pressure readings on lisinopril 20 mg so HCTZ 12.5 mg was added.  Since his last visit he's checking his BP at home which is running 110's/60's-70's. He denies chest pain, shortness of breath, cough. He did notice a cough during the beginning of lisinopril initiation, no recent cough since.  BP Readings from Last 3 Encounters:  02/27/18 126/84  01/10/18 (!) 165/75  01/09/18 (!) 140/92     Review of Systems  Eyes: Negative for visual disturbance.  Respiratory: Negative for cough and shortness of breath.   Cardiovascular: Negative for chest pain.  Neurological: Negative for dizziness and headaches.       Past Medical History:  Diagnosis Date  . Allergy   . Essential hypertension      Social History   Socioeconomic History  . Marital status: Married    Spouse name: Not on file  . Number of children: Not on file  . Years of education: Not on file  . Highest education level: Not on file  Occupational History  . 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 Financial resource strain: Not on file  . Food insecurity:    Worry: Not on file    Inability: Not on file  . Transportation needs:    Medical: Not on file    Non-medical: Not on file  Tobacco Use  . Smoking status: Never Smoker  . Smokeless tobacco: Never Used  Substance and Sexual Activity  . Alcohol use: Yes    Alcohol/week: 1.0 standard drinks    Types: 1 Cans of beer per week  . Drug use: No  . Sexual activity: Not Currently  Lifestyle  . Physical activity:    Days per week: Not on file    Minutes per session: Not on file  . Stress: Not on file  Relationships  . Social connections:    Talks on phone: Not on file    Gets  together: Not on file    Attends religious service: Not on file    Active member of club or organization: Not on file    Attends meetings of clubs or organizations: Not on file    Relationship status: Not on file  . Intimate partner violence:    Fear of current or ex partner: Not on file    Emotionally abused: Not on file    Physically abused: Not on file    Forced sexual activity: Not on file  Other Topics Concern  . Not on file  Social History Narrative   Married.   3 children.   Works for [employer].   Enjoys spending time with family, going to [employer], bike riding.    Past Surgical History:  Procedure Laterality Date  . APPENDECTOMY      Family History  Problem Relation Age of Onset  . Breast cancer Mother   . Diabetes Father   . Heart disease Father   . Hyperlipidemia Father   . Hypertension Father   . Diabetes Sister   . Cancer Maternal Grandmother   . Cancer Maternal Grandfather   . Cancer Paternal Grandmother   . Diabetes Paternal Grandfather   . Heart  disease Paternal Grandfather   . Hyperlipidemia Paternal Grandfather   . Hypertension Paternal Grandfather   . Stroke Paternal Grandfather     No Known Allergies  Current Outpatient Medications on File Prior to Visit  Medication Sig Dispense Refill  . acetaminophen (TYLENOL) 325 MG tablet Take 2 tablets (650 mg total) by mouth every 6 (six) hours as needed. 60 tablet 0  . lisinopril-hydrochlorothiazide (ZESTORETIC) 20-12.5 MG tablet Take 1 tablet by mouth daily. For blood pressure. 90 tablet 3  . Multiple Vitamins-Minerals (MULTIVITAMIN WITH MINERALS) tablet Take 1 tablet by mouth daily.    . Omega-3 Fatty Acids (FISH OIL PO) Take 2,000 mg by mouth daily.      No current facility-administered medications on file prior to visit.     BP 126/84   Pulse 74   Temp 97.7 F (36.5 C) (Oral)   Ht 6' (1.829 m)   Wt 215 lb (97.5 kg)   SpO2 98%   BMI 29.16 kg/m    Objective:   Physical Exam  Constitutional: He  appears well-nourished.  Neck: Neck supple.  Cardiovascular: Normal rate and regular rhythm.  Respiratory: Effort normal and breath sounds normal.  Skin: Skin is warm and dry.           Assessment & Plan:

## 2018-02-27 NOTE — Patient Instructions (Signed)
Stop by the lab prior to leaving today. I will notify you of your results once received.   Continue taking lisinopril-hydrochlorothiazide 20-12.5 mg for blood pressure.  Please schedule a physical with me in March/April this year. You may also schedule a lab only appointment 3-4 days prior. We will discuss your lab results in detail during your physical.  It was a pleasure to see you today!

## 2018-03-16 ENCOUNTER — Telehealth: Payer: Self-pay

## 2018-03-16 NOTE — Telephone Encounter (Signed)
For 12 days pt has been sick; prod cough with yellow white phlegm, no fever, SOB, or wheezing. Pt said grandchild just dx with RSV. Pt has been out of work this week and has been drinking plenty of fluids.Pt will use humidifier. Pt having broken sleep due to cough. Pt scheduled appt 03/17/18 at 11:20 with Gentry Fitz NP. If pt condition worsens prior to appt pt will go to [facility] or ED.  FYI to Gentry Fitz NP.

## 2018-03-16 NOTE — Telephone Encounter (Signed)
Noted and will evaluate.  

## 2018-03-17 ENCOUNTER — Encounter: Payer: Self-pay | Admitting: Primary Care

## 2018-03-17 ENCOUNTER — Ambulatory Visit: Payer: BLUE CROSS/BLUE SHIELD | Admitting: Primary Care

## 2018-03-17 VITALS — BP 126/80 | HR 100 | Temp 98.7°F | Ht 72.0 in | Wt 215.0 lb

## 2018-03-17 DIAGNOSIS — J019 Acute sinusitis, unspecified: Secondary | ICD-10-CM | POA: Diagnosis not present

## 2018-03-17 DIAGNOSIS — B9689 Other specified bacterial agents as the cause of diseases classified elsewhere: Secondary | ICD-10-CM | POA: Insufficient documentation

## 2018-03-17 MED ORDER — BENZONATATE 200 MG PO CAPS: 200.0000 mg | ORAL_CAPSULE | Freq: Three times a day (TID) | ORAL | 0 refills | Status: DC | PRN

## 2018-03-17 MED ORDER — AMOXICILLIN-POT CLAVULANATE 875-125 MG PO TABS: 1.0000 | ORAL_TABLET | Freq: Two times a day (BID) | ORAL | 0 refills | Status: DC

## 2018-03-17 NOTE — Patient Instructions (Signed)
Start Augmentin antibiotics for the infection Take 1 tablet by mouth twice daily for 10 days.  You may take Benzonatate capsules for cough. Take 1 capsule by mouth three times daily as needed for cough.  Make sure to drink plenty of water and rest.  It was a pleasure to see you today!

## 2018-03-17 NOTE — Assessment & Plan Note (Signed)
Symptoms for the last two weeks, feeling worse now. Exam consistent for acute bacterial involvement.  Rx for Augmentin 10 day course and benzonatate capsules sent to pharmacy. Fluids, rest, follow up PRN.

## 2018-03-17 NOTE — Progress Notes (Signed)
Subjective:    Patient ID: Willie Good, male    DOB: 1960-10-06, 57 y.o.   MRN: [id number]  HPI  Willie Good is a 58 year old male who presents today with a chief complaint of sinus pressure.  He also reports chest congestion, nasal congestion, body aches, cough. His cough is productive with white and some yellow sputum. He's expelling thick green and yellow mucous from his nasal cavity. He's been exposed to RSV from his grandchildren. His denies fevers, chills. He's been using a Neti Pot rinse nightly with temporary improvement.  He continues to notice a moderate amount of nasal discharge.  Review of Systems  Constitutional: Positive for fatigue. Negative for chills and fever.  HENT: Positive for congestion and sinus pressure.   Respiratory: Positive for cough and shortness of breath.        Past Medical History:  Diagnosis Date  . Allergy   . Essential hypertension      Social History   Socioeconomic History  . Marital status: Married    Spouse name: Not on file  . Number of children: Not on file  . Years of education: Not on file  . Highest education level: Not on file  Occupational History  . 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 Financial resource strain: Not on file  . Food insecurity:    Worry: Not on file    Inability: Not on file  . Transportation needs:    Medical: Not on file    Non-medical: Not on file  Tobacco Use  . Smoking status: Never Smoker  . Smokeless tobacco: Never Used  Substance and Sexual Activity  . Alcohol use: Yes    Alcohol/week: 1.0 standard drinks    Types: 1 Cans of beer per week  . Drug use: No  . Sexual activity: Not Currently  Lifestyle  . Physical activity:    Days per week: Not on file    Minutes per session: Not on file  . Stress: Not on file  Relationships  . Social connections:    Talks on phone: Not on file    Gets together: Not on file    Attends religious service: Not on file    Active member of club or  organization: Not on file    Attends meetings of clubs or organizations: Not on file    Relationship status: Not on file  . Intimate partner violence:    Fear of current or ex partner: Not on file    Emotionally abused: Not on file    Physically abused: Not on file    Forced sexual activity: Not on file  Other Topics Concern  . Not on file  Social History Narrative   Married.   3 children.   Works for [employer].   Enjoys spending time with family, going to [employer], bike riding.    Past Surgical History:  Procedure Laterality Date  . APPENDECTOMY      Family History  Problem Relation Age of Onset  . Breast cancer Mother   . Diabetes Father   . Heart disease Father   . Hyperlipidemia Father   . Hypertension Father   . Diabetes Sister   . Cancer Maternal Grandmother   . Cancer Maternal Grandfather   . Cancer Paternal Grandmother   . Diabetes Paternal Grandfather   . Heart disease Paternal Grandfather   . Hyperlipidemia Paternal Grandfather   . Hypertension Paternal Grandfather   . Stroke Paternal Grandfather  No Known Allergies  Current Outpatient Medications on File Prior to Visit  Medication Sig Dispense Refill  . acetaminophen (TYLENOL) 325 MG tablet Take 2 tablets (650 mg total) by mouth every 6 (six) hours as needed. 60 tablet 0  . lisinopril-hydrochlorothiazide (ZESTORETIC) 20-12.5 MG tablet Take 1 tablet by mouth daily. For blood pressure. 90 tablet 3  . Multiple Vitamins-Minerals (MULTIVITAMIN WITH MINERALS) tablet Take 1 tablet by mouth daily.    . Omega-3 Fatty Acids (FISH OIL PO) Take 2,000 mg by mouth daily.      No current facility-administered medications on file prior to visit.     BP 126/80   Pulse 100   Temp 98.7 F (37.1 C) (Oral)   Ht 6' (1.829 m)   Wt 215 lb (97.5 kg)   SpO2 97%   BMI 29.16 kg/m    Objective:   Physical Exam  Constitutional: He appears well-nourished. He appears ill.  HENT:  Right Ear: Tympanic membrane and ear canal  normal.  Left Ear: Tympanic membrane and ear canal normal.  Nose: Mucosal edema present. Right sinus exhibits maxillary sinus tenderness. Right sinus exhibits no frontal sinus tenderness. Left sinus exhibits maxillary sinus tenderness. Left sinus exhibits no frontal sinus tenderness.  Mouth/Throat: Oropharynx is clear and moist.  Neck: Neck supple.  Cardiovascular: Normal rate and regular rhythm.  Respiratory: Effort normal. He has no wheezes. He has rhonchi in the right upper field, the right lower field, the left upper field and the left lower field.  Deep congested cough during exam  Skin: Skin is warm and dry.           Assessment & Plan:

## 2018-04-11 ENCOUNTER — Other Ambulatory Visit: Payer: Self-pay | Admitting: Primary Care

## 2018-04-11 DIAGNOSIS — Z125 Encounter for screening for malignant neoplasm of prostate: Secondary | ICD-10-CM

## 2018-04-11 DIAGNOSIS — I10 Essential (primary) hypertension: Secondary | ICD-10-CM

## 2018-04-21 ENCOUNTER — Other Ambulatory Visit (INDEPENDENT_AMBULATORY_CARE_PROVIDER_SITE_OTHER): Payer: BLUE CROSS/BLUE SHIELD

## 2018-04-21 DIAGNOSIS — Z125 Encounter for screening for malignant neoplasm of prostate: Secondary | ICD-10-CM

## 2018-04-21 DIAGNOSIS — I10 Essential (primary) hypertension: Secondary | ICD-10-CM

## 2018-04-21 LAB — COMPREHENSIVE METABOLIC PANEL
ALT: 14 U/L (ref 0–53)
AST: 21 U/L (ref 0–37)
Albumin: 4.6 g/dL (ref 3.5–5.2)
Alkaline Phosphatase: 75 U/L (ref 39–117)
BUN: 15 mg/dL (ref 6–23)
CO2: 26 mEq/L (ref 19–32)
Calcium: 9.9 mg/dL (ref 8.4–10.5)
Chloride: 94 mEq/L — ABNORMAL LOW (ref 96–112)
Creatinine, Ser: 1.3 mg/dL (ref 0.40–1.50)
GFR: 56.65 mL/min — ABNORMAL LOW (ref >60.00)
GLUCOSE: 75 mg/dL (ref 70–99)
Potassium: 4.3 mEq/L (ref 3.5–5.1)
Sodium: 131 mEq/L — ABNORMAL LOW (ref 135–145)
Total Bilirubin: 0.9 mg/dL (ref 0.2–1.2)
Total Protein: 7.1 g/dL (ref 6.0–8.3)

## 2018-04-21 LAB — LIPID PANEL
CHOLESTEROL: 215 mg/dL — AB (ref 0–200)
HDL: 47.8 mg/dL (ref >39.00)
LDL Cholesterol: 150 mg/dL — ABNORMAL HIGH (ref 0–99)
NonHDL: 167.2
Total CHOL/HDL Ratio: 4
Triglycerides: 85 mg/dL (ref 0.0–149.0)
VLDL: 17 mg/dL (ref 0.0–40.0)

## 2018-04-21 LAB — PSA: PSA: 1.34 ng/mL (ref 0.10–4.00)

## 2018-04-21 LAB — HEMOGLOBIN A1C: HEMOGLOBIN A1C: 5.4 % (ref 4.6–6.5)

## 2018-04-28 ENCOUNTER — Encounter: Payer: Self-pay | Admitting: Primary Care

## 2018-04-28 ENCOUNTER — Ambulatory Visit (INDEPENDENT_AMBULATORY_CARE_PROVIDER_SITE_OTHER): Payer: BLUE CROSS/BLUE SHIELD | Admitting: Primary Care

## 2018-04-28 ENCOUNTER — Encounter: Payer: BLUE CROSS/BLUE SHIELD | Admitting: Primary Care

## 2018-04-28 ENCOUNTER — Other Ambulatory Visit: Payer: Self-pay | Admitting: Primary Care

## 2018-04-28 VITALS — BP 114/70 | HR 84 | Temp 98.0°F | Ht 72.0 in | Wt 200.0 lb

## 2018-04-28 DIAGNOSIS — I10 Essential (primary) hypertension: Secondary | ICD-10-CM | POA: Diagnosis not present

## 2018-04-28 DIAGNOSIS — E785 Hyperlipidemia, unspecified: Secondary | ICD-10-CM | POA: Diagnosis not present

## 2018-04-28 DIAGNOSIS — Z0000 Encounter for general adult medical examination without abnormal findings: Secondary | ICD-10-CM | POA: Diagnosis not present

## 2018-04-28 DIAGNOSIS — N289 Disorder of kidney and ureter, unspecified: Secondary | ICD-10-CM | POA: Diagnosis not present

## 2018-04-28 DIAGNOSIS — R5383 Other fatigue: Secondary | ICD-10-CM | POA: Diagnosis not present

## 2018-04-28 LAB — VITAMIN B12: VITAMIN B 12: 585 pg/mL (ref 211–911)

## 2018-04-28 LAB — BASIC METABOLIC PANEL
BUN: 15 mg/dL (ref 6–23)
CO2: 28 mEq/L (ref 19–32)
Calcium: 9.7 mg/dL (ref 8.4–10.5)
Chloride: 95 mEq/L — ABNORMAL LOW (ref 96–112)
Creatinine, Ser: 1.32 mg/dL (ref 0.40–1.50)
GFR: 55.65 mL/min — ABNORMAL LOW (ref >60.00)
Glucose, Bld: 82 mg/dL (ref 70–99)
Potassium: 3.9 mEq/L (ref 3.5–5.1)
Sodium: 133 mEq/L — ABNORMAL LOW (ref 135–145)

## 2018-04-28 LAB — CBC
HCT: 40.9 % (ref 39.0–52.0)
Hemoglobin: 14.1 g/dL (ref 13.0–17.0)
MCHC: 34.4 g/dL (ref 30.0–36.0)
MCV: 85.1 fl (ref 78.0–100.0)
Platelets: 431 10*3/uL — ABNORMAL HIGH (ref 150.0–400.0)
RBC: 4.8 Mil/uL (ref 4.22–5.81)
RDW: 14 % (ref 11.5–15.5)
WBC: 5.1 10*3/uL (ref 4.0–10.5)

## 2018-04-28 LAB — TSH: TSH: 3.85 u[IU]/mL (ref 0.35–4.50)

## 2018-04-28 MED ORDER — LISINOPRIL 30 MG PO TABS: 30.0000 mg | ORAL_TABLET | Freq: Every day | ORAL | 0 refills | Status: DC

## 2018-04-28 MED ORDER — ROSUVASTATIN CALCIUM 10 MG PO TABS
10.0000 mg | ORAL_TABLET | Freq: Every day | ORAL | 3 refills | Status: DC
Start: 2018-04-28 — End: 2019-04-17

## 2018-04-28 NOTE — Patient Instructions (Addendum)
Stop by the lab prior to leaving today. I will notify you of your results once received.   Start exercising. You should be getting 150 minutes of moderate intensity exercise weekly.  Continue to work on a healthy diet. Ensure you are consuming 64 ounces of water daily.  Start rosuvastatin 5 mg daily for cholesterol. Please update me if you have any problems.  We may change your blood pressure medication, I will be in touch regarding this.  It was a pleasure to see you today!   Preventive Care 40-64 Years, Male Preventive care refers to lifestyle choices and visits with your health care provider that can promote health and wellness. What does preventive care include?   A yearly physical exam. This is also called an annual well check.  Dental exams once or twice a year.  Routine eye exams. Ask your health care provider how often you should have your eyes checked.  Personal lifestyle choices, including: ? Daily care of your teeth and gums. ? Regular physical activity. ? Eating a healthy diet. ? Avoiding tobacco and drug use. ? Limiting alcohol use. ? Practicing safe sex. ? Taking low-dose aspirin every day starting at age 60. What happens during an annual well check? The services and screenings done by your health care provider during your annual well check will depend on your age, overall health, lifestyle risk factors, and family history of disease. Counseling Your health care provider may ask you questions about your:  Alcohol use.  Tobacco use.  Drug use.  Emotional well-being.  Home and relationship well-being.  Sexual activity.  Eating habits.  Work and work Statistician. Screening You may have the following tests or measurements:  Height, weight, and BMI.  Blood pressure.  Lipid and cholesterol levels. These may be checked every 5 years, or more frequently if you are over 37 years old.  Skin check.  Lung cancer screening. You may have this screening  every year starting at age 85 if you have a 30-pack-year history of smoking and currently smoke or have quit within the past 15 years.  Colorectal cancer screening. All adults should have this screening starting at age 26 and continuing until age 44. Your health care provider may recommend screening at age 1. You will have tests every 1-10 years, depending on your results and the type of screening test. People at increased risk should start screening at an earlier age. Screening tests may include: ? Guaiac-based fecal occult blood testing. ? Fecal immunochemical test (FIT). ? Stool DNA test. ? Virtual colonoscopy. ? Sigmoidoscopy. During this test, a flexible tube with a tiny camera (sigmoidoscope) is used to examine your rectum and lower colon. The sigmoidoscope is inserted through your anus into your rectum and lower colon. ? Colonoscopy. During this test, a long, thin, flexible tube with a tiny camera (colonoscope) is used to examine your entire colon and rectum.  Prostate cancer screening. Recommendations will vary depending on your family history and other risks.  Hepatitis C blood test.  Hepatitis B blood test.  Sexually transmitted disease (STD) testing.  Diabetes screening. This is done by checking your blood sugar (glucose) after you have not eaten for a while (fasting). You may have this done every 1-3 years. Discuss your test results, treatment options, and if necessary, the need for more tests with your health care provider. Vaccines Your health care provider may recommend certain vaccines, such as:  Influenza vaccine. This is recommended every year.  Tetanus, diphtheria, and acellular pertussis (  Tdap, Td) vaccine. You may need a Td booster every 10 years.  Varicella vaccine. You may need this if you have not been vaccinated.  Zoster vaccine. You may need this after age 27.  Measles, mumps, and rubella (MMR) vaccine. You may need at least one dose of MMR if you were born  in 1957 or later. You may also need a second dose.  Pneumococcal 13-valent conjugate (PCV13) vaccine. You may need this if you have certain conditions and have not been vaccinated.  Pneumococcal polysaccharide (PPSV23) vaccine. You may need one or two doses if you smoke cigarettes or if you have certain conditions.  Meningococcal vaccine. You may need this if you have certain conditions.  Hepatitis A vaccine. You may need this if you have certain conditions or if you travel or work in places where you may be exposed to hepatitis A.  Hepatitis B vaccine. You may need this if you have certain conditions or if you travel or work in places where you may be exposed to hepatitis B.  Haemophilus influenzae type b (Hib) vaccine. You may need this if you have certain risk factors. Talk to your health care provider about which screenings and vaccines you need and how often you need them. This information is not intended to replace advice given to you by your health care provider. Make sure you discuss any questions you have with your health care provider. Document Released: 03/07/2015 Document Revised: 03/31/2017 Document Reviewed: 12/10/2014 Elsevier Interactive Patient Education  2019 [employer].

## 2018-04-28 NOTE — Assessment & Plan Note (Signed)
LDL of 150. Given strong family history of heart disease, his ASCVD risk score of 7.7%, and hypertension we will start Crestor 5 mg. He was on Lipitor in the past but couldn't tolerate given myalgias. He will update if these symptoms occur.  Repeat lipids and LFT's in 6 weeks.

## 2018-04-28 NOTE — Assessment & Plan Note (Signed)
Since acute bacterial sinusitis in late January 2020. Recommended regular exercise, healthy diet. Check labs today including TSH, CBC, B12.

## 2018-04-28 NOTE — Assessment & Plan Note (Signed)
At goal during visit today. BMP with decline in renal function, could be secondary to HCTZ. Start by repeating BMP today, if still below goal then will stop HCTZ and increase lisinopril to 30 mg.  He is not on NSAID's.

## 2018-04-28 NOTE — Assessment & Plan Note (Signed)
Noted on recent labs with GFR of 56.  He is not on NSAID's. Start by repeating BMP. Consider removing HCTZ from BP regimen as this could be the culprit. Await results.

## 2018-04-28 NOTE — Assessment & Plan Note (Signed)
Declines tetanus and influenza. PSA UTD. Colonoscopy UTD. Recommended to work on diet, start regular exercise.  Exam unremarkable.  Labs reviewed and additional labs pending. Follow up in 1 year for CPE.

## 2018-04-28 NOTE — Progress Notes (Signed)
Subjective:    Patient ID: Willie Good, male    DOB: March 08, 1960, 58 y.o.   MRN: [id number]  HPI  Willie Good is a 58 year old male who presents today for complete physical.  He has noticed feeling more tired since late January 2020 after treatment for acute bacterial sinusitis. Will wake up feeling tired despite a full nights sleep, does sometimes feel the urge to nap during the day. His wife reports occasional snoring, wakes during the night but doesn't gasp for air.   Immunizations: -Tetanus: Completed over 10 years ago, declines  -Influenza: Declines   Diet: He endorses a healthy diet Breakfast: Skips during the work week, eggs/meat during weekends Lunch: Yogurt, granola, fruit, nuts most days of the week; pasta, meat, restaurants during the weekend Dinner: Skips during work week, meat/vegetables/starch during weekend Snacks: None Desserts: 2 days weekly  Beverages: Water, G2 Gatorade, black coffee, sweet tea with artifical sweetner  Exercise: He is not exercising  Eye exam: Completed several years ago  Dental exam: No recent exam Colonoscopy: Completed in 2019, due again 2029 PSA: 1.34 Hep C Screen: Negative   BP Readings from Last 3 Encounters:  04/28/18 114/70  03/17/18 126/80  02/27/18 126/84   The 10-year ASCVD risk score Mikey Bussing DC Jr., et al., 2013) is: 7.7%   Values used to calculate the score:     Age: 36 years     Sex: Male     Is Non-Hispanic African American: No     Diabetic: No     Tobacco smoker: No     Systolic Blood Pressure: 563 mmHg     Is BP treated: Yes     HDL Cholesterol: 47.8 mg/dL     Total Cholesterol: 215 mg/dL   Review of Systems  Constitutional: Positive for fatigue. Negative for unexpected weight change.  HENT: Negative for rhinorrhea.   Respiratory: Negative for cough and shortness of breath.   Cardiovascular: Negative for chest pain.  Gastrointestinal: Negative for constipation and diarrhea.  Genitourinary: Negative for  difficulty urinating.  Musculoskeletal: Negative for arthralgias.  Skin: Negative for rash.  Allergic/Immunologic: Negative for environmental allergies.  Neurological: Negative for dizziness, numbness and headaches.  Psychiatric/Behavioral: The patient is not nervous/anxious.        Past Medical History:  Diagnosis Date  . Allergy   . Essential hypertension      Social History   Socioeconomic History  . Marital status: Married    Spouse name: Not on file  . Number of children: Not on file  . Years of education: Not on file  . Highest education level: Not on file  Occupational History  . 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 Financial resource strain: Not on file  . Food insecurity:    Worry: Not on file    Inability: Not on file  . Transportation needs:    Medical: Not on file    Non-medical: Not on file  Tobacco Use  . Smoking status: Never Smoker  . Smokeless tobacco: Never Used  Substance and Sexual Activity  . Alcohol use: Yes    Alcohol/week: 1.0 standard drinks    Types: 1 Cans of beer per week  . Drug use: No  . Sexual activity: Not Currently  Lifestyle  . Physical activity:    Days per week: Not on file    Minutes per session: Not on file  . Stress: Not on file  Relationships  . Social connections:  Talks on phone: Not on file    Gets together: Not on file    Attends religious service: Not on file    Active member of club or organization: Not on file    Attends meetings of clubs or organizations: Not on file    Relationship status: Not on file  . Intimate partner violence:    Fear of current or ex partner: Not on file    Emotionally abused: Not on file    Physically abused: Not on file    Forced sexual activity: Not on file  Other Topics Concern  . Not on file  Social History Narrative   Married.   3 children.   Works for [employer].   Enjoys spending time with family, going to [employer], bike riding.    Past Surgical History:  Procedure  Laterality Date  . APPENDECTOMY      Family History  Problem Relation Age of Onset  . Breast cancer Mother   . Diabetes Father   . Heart disease Father   . Hyperlipidemia Father   . Hypertension Father   . Diabetes Sister   . Cancer Maternal Grandmother   . Cancer Maternal Grandfather   . Cancer Paternal Grandmother   . Diabetes Paternal Grandfather   . Heart disease Paternal Grandfather   . Hyperlipidemia Paternal Grandfather   . Hypertension Paternal Grandfather   . Stroke Paternal Grandfather     No Known Allergies  Current Outpatient Medications on File Prior to Visit  Medication Sig Dispense Refill  . acetaminophen (TYLENOL) 325 MG tablet Take 2 tablets (650 mg total) by mouth every 6 (six) hours as needed. 60 tablet 0  . lisinopril-hydrochlorothiazide (ZESTORETIC) 20-12.5 MG tablet Take 1 tablet by mouth daily. For blood pressure. 90 tablet 3  . Multiple Vitamins-Minerals (MULTIVITAMIN WITH MINERALS) tablet Take 1 tablet by mouth daily.    . Omega-3 Fatty Acids (FISH OIL PO) Take 2,000 mg by mouth daily.      No current facility-administered medications on file prior to visit.     BP 114/70   Pulse 84   Temp 98 F (36.7 C) (Oral)   Ht 6' (1.829 m)   Wt 200 lb (90.7 kg) Comment: refuse to weight  SpO2 98%   BMI 27.12 kg/m    Objective:   Physical Exam  Constitutional: He is oriented to person, place, and time. He appears well-nourished.  HENT:  Mouth/Throat: No oropharyngeal exudate.  Eyes: Pupils are equal, round, and reactive to light. EOM are normal.  Neck: Neck supple. No thyromegaly present.  Cardiovascular: Normal rate and regular rhythm.  Respiratory: Effort normal and breath sounds normal.  GI: Soft. Bowel sounds are normal. There is no abdominal tenderness.  Musculoskeletal: Normal range of motion.  Neurological: He is alert and oriented to person, place, and time.  Skin: Skin is warm and dry.  Psychiatric: He has a normal mood and affect.            Assessment & Plan:

## 2018-05-01 ENCOUNTER — Other Ambulatory Visit: Payer: Self-pay | Admitting: Primary Care

## 2018-05-01 DIAGNOSIS — K2900 Acute gastritis without bleeding: Secondary | ICD-10-CM

## 2018-05-05 ENCOUNTER — Ambulatory Visit: Payer: BLUE CROSS/BLUE SHIELD | Admitting: Podiatry

## 2018-05-05 ENCOUNTER — Other Ambulatory Visit: Payer: Self-pay

## 2018-05-05 ENCOUNTER — Ambulatory Visit (INDEPENDENT_AMBULATORY_CARE_PROVIDER_SITE_OTHER): Payer: BLUE CROSS/BLUE SHIELD

## 2018-05-05 DIAGNOSIS — M2012 Hallux valgus (acquired), left foot: Secondary | ICD-10-CM

## 2018-05-05 DIAGNOSIS — M21622 Bunionette of left foot: Secondary | ICD-10-CM | POA: Diagnosis not present

## 2018-05-05 DIAGNOSIS — B351 Tinea unguium: Secondary | ICD-10-CM | POA: Diagnosis not present

## 2018-05-05 DIAGNOSIS — Q828 Other specified congenital malformations of skin: Secondary | ICD-10-CM

## 2018-05-06 ENCOUNTER — Other Ambulatory Visit: Payer: Self-pay | Admitting: Podiatry

## 2018-05-06 DIAGNOSIS — M21622 Bunionette of left foot: Secondary | ICD-10-CM

## 2018-05-08 ENCOUNTER — Telehealth: Payer: Self-pay | Admitting: Primary Care

## 2018-05-08 NOTE — Telephone Encounter (Signed)
Pt need to speak to nurse concerning up and down with his BP. Please call pt

## 2018-05-09 NOTE — Telephone Encounter (Signed)
Please notify patient that his BP goal is anywhere between 110-130/60-80's. If he's within that range then his is stable. Have him follow up for BP check if he notes otherwise consistently. Also remind him to only check BP when resting, once daily, using the same arm.

## 2018-05-09 NOTE — Telephone Encounter (Signed)
Spoken and notified patient of Kate Clark's comments. Patient verbalized understanding.  

## 2018-05-09 NOTE — Telephone Encounter (Signed)
Spoken to patient regarding his BP. Patient is concern regarding his BP readings since on the new BP medication. He get readings like last nigh 114/74 after work. Then yesterday ti spike up to 173/112. He also see consistent readings like in the 128-119 over 80-70 which is higher from his previous medication. Patient does have appointment to see Allie Bossier on 05/12/2018 to discuss

## 2018-05-12 ENCOUNTER — Encounter: Payer: Self-pay | Admitting: Primary Care

## 2018-05-12 ENCOUNTER — Ambulatory Visit: Payer: BLUE CROSS/BLUE SHIELD | Admitting: Primary Care

## 2018-05-12 ENCOUNTER — Other Ambulatory Visit: Payer: Self-pay

## 2018-05-12 DIAGNOSIS — N289 Disorder of kidney and ureter, unspecified: Secondary | ICD-10-CM

## 2018-05-12 DIAGNOSIS — I10 Essential (primary) hypertension: Secondary | ICD-10-CM

## 2018-05-12 LAB — BASIC METABOLIC PANEL
BUN: 17 mg/dL (ref 6–23)
CO2: 25 mEq/L (ref 19–32)
Calcium: 9.5 mg/dL (ref 8.4–10.5)
Chloride: 102 mEq/L (ref 96–112)
Creatinine, Ser: 1.11 mg/dL (ref 0.40–1.50)
GFR: 67.96 mL/min (ref >60.00)
Glucose, Bld: 70 mg/dL (ref 70–99)
POTASSIUM: 4.1 meq/L (ref 3.5–5.1)
Sodium: 139 mEq/L (ref 135–145)

## 2018-05-12 MED ORDER — LISINOPRIL 30 MG PO TABS: 30.0000 mg | ORAL_TABLET | Freq: Every day | ORAL | 3 refills | Status: DC

## 2018-05-12 NOTE — Assessment & Plan Note (Signed)
HCTZ removed from regimen. Repeat BMP pending today. Discussed to avoid NSAID's.

## 2018-05-12 NOTE — Progress Notes (Signed)
Subjective:    Patient ID: Willie Good, male    DOB: 09-06-1960, 58 y.o.   MRN: [id number]  HPI  Willie Good is a 58 year old male who presents today to discuss blood pressure.  He was last evaluated on 04/28/18 for CPE, BP well controlled on HCTZ and lisinopril but BMP showed hyponatremia and decreased renal function. Given these results his HCTZ was discontinued and lisinopril was increased to 30 mg.   He called into our office on 05/09/18 with reports of BP readings of 114/74, 173/112, 119-128/70-80.   Today he's brought logs of BP readings which are running 110's-120's/60's-80's. He denies dizziness, chest pain, cough.   BP Readings from Last 3 Encounters:  05/12/18 128/76  04/28/18 114/70  03/17/18 126/80     Review of Systems  Respiratory: Negative for shortness of breath.   Cardiovascular: Negative for chest pain.  Neurological: Negative for dizziness and headaches.       Past Medical History:  Diagnosis Date  . Allergy   . Essential hypertension   . Sepsis ([place]) 06/22/2017     Social History   Socioeconomic History  . Marital status: Married    Spouse name: Not on file  . Number of children: Not on file  . Years of education: Not on file  . Highest education level: Not on file  Occupational History  . 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 Financial resource strain: Not on file  . Food insecurity:    Worry: Not on file    Inability: Not on file  . Transportation needs:    Medical: Not on file    Non-medical: Not on file  Tobacco Use  . Smoking status: Never Smoker  . Smokeless tobacco: Never Used  Substance and Sexual Activity  . Alcohol use: Yes    Alcohol/week: 1.0 standard drinks    Types: 1 Cans of beer per week  . Drug use: No  . Sexual activity: Not Currently  Lifestyle  . Physical activity:    Days per week: Not on file    Minutes per session: Not on file  . Stress: Not on file  Relationships  . Social connections:    Talks on  phone: Not on file    Gets together: Not on file    Attends religious service: Not on file    Active member of club or organization: Not on file    Attends meetings of clubs or organizations: Not on file    Relationship status: Not on file  . Intimate partner violence:    Fear of current or ex partner: Not on file    Emotionally abused: Not on file    Physically abused: Not on file    Forced sexual activity: Not on file  Other Topics Concern  . Not on file  Social History Narrative   Married.   3 children.   Works for [employer].   Enjoys spending time with family, going to [employer], bike riding.    Past Surgical History:  Procedure Laterality Date  . APPENDECTOMY      Family History  Problem Relation Age of Onset  . Breast cancer Mother   . Diabetes Father   . Heart disease Father   . Hyperlipidemia Father   . Hypertension Father   . Diabetes Sister   . Cancer Maternal Grandmother   . Cancer Maternal Grandfather   . Cancer Paternal Grandmother   . Diabetes Paternal Grandfather   .  Heart disease Paternal Grandfather   . Hyperlipidemia Paternal Grandfather   . Hypertension Paternal Grandfather   . Stroke Paternal Grandfather     No Known Allergies  Current Outpatient Medications on File Prior to Visit  Medication Sig Dispense Refill  . acetaminophen (TYLENOL) 325 MG tablet Take 2 tablets (650 mg total) by mouth every 6 (six) hours as needed. 60 tablet 0  . Multiple Vitamins-Minerals (MULTIVITAMIN WITH MINERALS) tablet Take 1 tablet by mouth daily.    . NON FORMULARY [facility]  ANTIFUNGAL (NAIL)- #1    . Omega-3 Fatty Acids (FISH OIL PO) Take 2,000 mg by mouth daily.     . rosuvastatin (CRESTOR) 10 MG tablet Take 1 tablet (10 mg total) by mouth daily. For cholesterol. 90 tablet 3   No current facility-administered medications on file prior to visit.     BP 128/76   Pulse 85   Temp 97.7 F (36.5 C) (Oral)   SpO2 98%    Objective:   Physical Exam   Constitutional: He appears well-nourished.  Neck: Neck supple.  Cardiovascular: Normal rate and regular rhythm.  Respiratory: Effort normal and breath sounds normal.  Skin: Skin is warm and dry.  Psychiatric: He has a normal mood and affect.           Assessment & Plan:

## 2018-05-12 NOTE — Patient Instructions (Addendum)
Stop by the lab prior to leaving today. I will notify you of your results once received.   Continue lisinopril 30 mg tablets for now.  Schedule a lab only appointment for 1 month to recheck cholesterol. Do not eat four hours prior.   It was a pleasure to see you today!

## 2018-05-12 NOTE — Assessment & Plan Note (Signed)
Stable in the office on lisinopril 30 mg. Home readings are just as good. Check BMP again today. Continue lisinopril as long as BMP looks good.

## 2018-05-14 NOTE — Progress Notes (Signed)
Subjective:   Patient ID: Willie Good, male   DOB: 58 y.o.   MRN: [id number]   HPI 58 year old male presents the office today for concerns of thick tone as well as toenail fungus.  Seen on the for last year.  He is concerned about the looks of it but he has no pain and denies any redness or drainage or any swelling.  He also has tenderness on the ball of the left foot underneath the fifth toe area.  He states it may be a callus that is painful with exercise, pressure.  No other concerns.   Review of Systems  All other systems reviewed and are negative.  Past Medical History:  Diagnosis Date  . Allergy   . Essential hypertension   . Sepsis ([place]) 06/22/2017    Past Surgical History:  Procedure Laterality Date  . APPENDECTOMY       Current Outpatient Medications:  .  NON FORMULARY, [facility]  ANTIFUNGAL (NAIL)- #1, Disp: , Rfl:  .  acetaminophen (TYLENOL) 325 MG tablet, Take 2 tablets (650 mg total) by mouth every 6 (six) hours as needed., Disp: 60 tablet, Rfl: 0 .  lisinopril (PRINIVIL,ZESTRIL) 30 MG tablet, Take 1 tablet (30 mg total) by mouth daily. For blood pressure., Disp: 90 tablet, Rfl: 3 .  Multiple Vitamins-Minerals (MULTIVITAMIN WITH MINERALS) tablet, Take 1 tablet by mouth daily., Disp: , Rfl:  .  Omega-3 Fatty Acids (FISH OIL PO), Take 2,000 mg by mouth daily. , Disp: , Rfl:  .  rosuvastatin (CRESTOR) 10 MG tablet, Take 1 tablet (10 mg total) by mouth daily. For cholesterol., Disp: 90 tablet, Rfl: 3  No Known Allergies       Objective:  Physical Exam  General: AAO x3, NAD  Dermatological: Nails are discolored with yellow-brown discoloration most notably the hallux toenail is hypertrophic.  There is no swelling redness or drainage or any signs of  infection of the toenail sites.  Hyperkeratotic lesion left foot submetatarsal 5.  No ongoing ulceration drainage or signs of infection.  No open lesions.  Vascular: Dorsalis Pedis artery and Posterior Tibial  artery pedal pulses are 2/4 bilateral with immedate capillary fill time. There is no pain with calf compression, swelling, warmth, erythema.   Neruologic: Grossly intact via light touch bilateral. Protective threshold with Semmes Wienstein monofilament intact to all pedal sites bilateral.   Musculoskeletal: HAV present. Muscular strength 5/5 in all groups tested bilateral.  Gait: Unassisted, Nonantalgic.       Assessment:   Onychomycosis, onychodystrophy, hyperkeratotic lesion    Plan:  -Treatment options discussed including all alternatives, risks, and complications -Etiology of symptoms were discussed -X-rays were obtained and reviewed given the discomfort in the left foot.  Bunion is present.  No evidence of acute fracture.  No foreign body. -We discussed options for nail fungus including oral, topical, toenail removal, laser.  After discussion he wants to do topical.  I prescribed a compound ointment today through [place]. -Debrided hyperkeratotic lesion without any complications or bleeding.  Discussed moisturizer and offloading.  Trula Slade DPM

## 2018-06-11 ENCOUNTER — Other Ambulatory Visit: Payer: Self-pay | Admitting: Primary Care

## 2018-06-11 DIAGNOSIS — E785 Hyperlipidemia, unspecified: Secondary | ICD-10-CM

## 2018-06-16 ENCOUNTER — Other Ambulatory Visit (INDEPENDENT_AMBULATORY_CARE_PROVIDER_SITE_OTHER): Payer: BLUE CROSS/BLUE SHIELD

## 2018-06-16 ENCOUNTER — Other Ambulatory Visit: Payer: Self-pay

## 2018-06-16 DIAGNOSIS — E785 Hyperlipidemia, unspecified: Secondary | ICD-10-CM

## 2018-06-16 LAB — LIPID PANEL
Cholesterol: 224 mg/dL — ABNORMAL HIGH (ref 0–200)
HDL: 47.5 mg/dL (ref >39.00)
LDL Cholesterol: 157 mg/dL — ABNORMAL HIGH (ref 0–99)
NonHDL: 176.37
Total CHOL/HDL Ratio: 5
Triglycerides: 98 mg/dL (ref 0.0–149.0)
VLDL: 19.6 mg/dL (ref 0.0–40.0)

## 2018-06-16 LAB — HEPATIC FUNCTION PANEL
ALT: 18 U/L (ref 0–53)
AST: 19 U/L (ref 0–37)
Albumin: 4.5 g/dL (ref 3.5–5.2)
Alkaline Phosphatase: 66 U/L (ref 39–117)
Bilirubin, Direct: 0.1 mg/dL (ref 0.0–0.3)
Total Bilirubin: 0.7 mg/dL (ref 0.2–1.2)
Total Protein: 7.2 g/dL (ref 6.0–8.3)

## 2018-08-26 IMAGING — MR MR 3D RECON AT [facility]
14 of 21 series · 29 of 48 positions shown · IV contrast (multihance)
Comparison: CT abdomen/pelvis dated 06/21/2017

CLINICAL DATA: Fever, sepsis, periportal edema and intrahepatic
biliary ductal dilatation on CT

EXAM:
MRI ABDOMEN WITHOUT AND WITH CONTRAST (INCLUDING MRCP)
TECHNIQUE: Multiplanar multisequence MR imaging of the abdomen was performed
both before and after the administration of intravenous contrast.
Heavily T2-weighted images of the biliary and pancreatic ducts were
obtained, and three-dimensional MRCP images were rendered by post
processing.
CONTRAST:  15mL MULTIHANCE GADOBENATE DIMEGLUMINE 529 MG/ML IV SOLN

[Series 4: cor tru fisp · coronal · 4.0mm · 0.82mm/px · 2 of 54 slices shown]
[im 1/54]
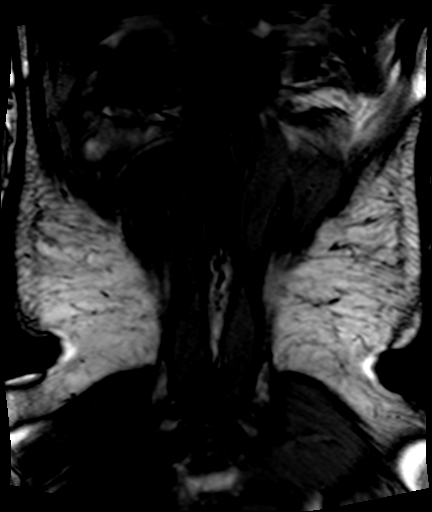
[im 54/54]
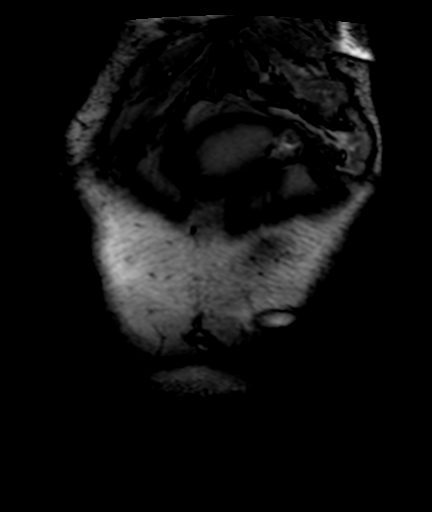

[Series 5: T2 fat-sat · axial · 7.0mm · 0.74mm/px · 1 of 30 slices shown]
[im 1/30]
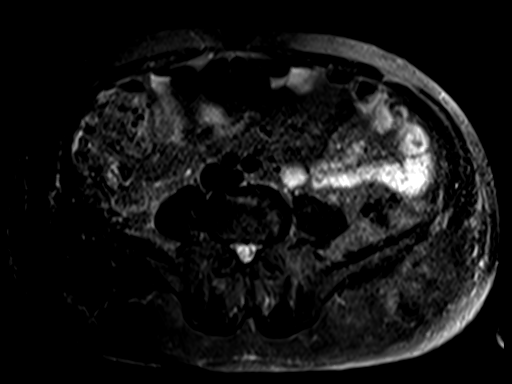

[Series 6: T2 · axial · 7.0mm · 0.74mm/px · 1 of 30 slices shown]
[im 1/30]
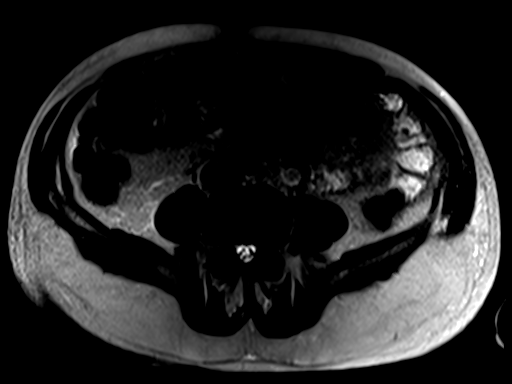

[Series 7: ax dual echo · axial · 7.0mm · 0.74mm/px · z∈[-106,+138]mm · 2 of 60 slices shown]
[im 1/60]
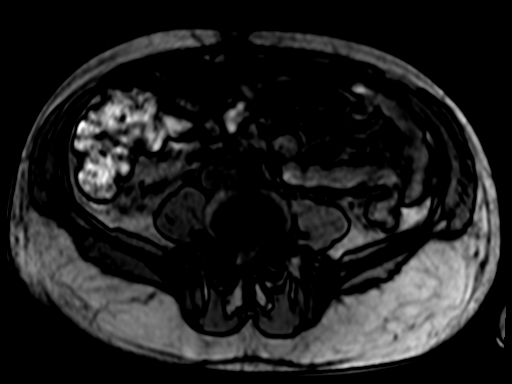
[im 60/60]
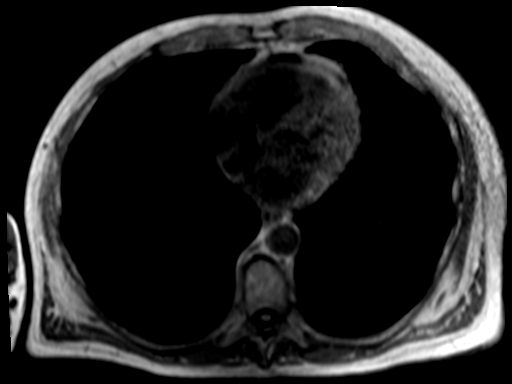

[Series 11: cor thins · coronal · 4.0mm · 0.94mm/px · 1 of 13 slices shown]
[im 1/13]
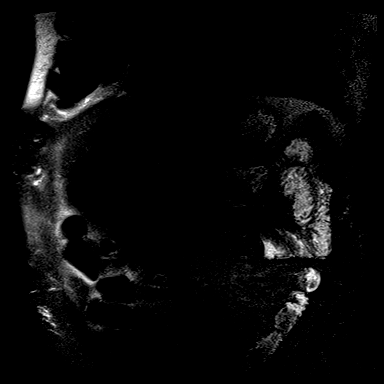

[Series 12: MRCP · coronal · 40.0mm · 0.91mm/px · 1 of 6 slices shown]
[im 1/6]
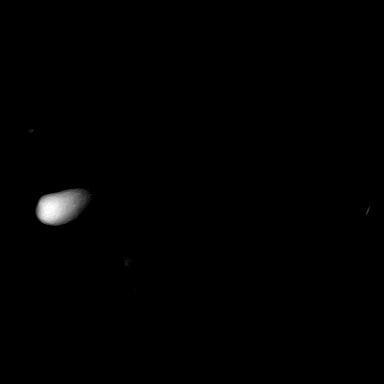

[Series 13: DWI · axial · 6.0mm · 1.88mm/px · z∈[-105,+154]mm · 4 of 111 slices shown]
[im 1/111]
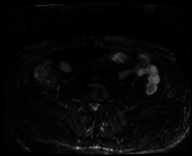
[im 37/111]
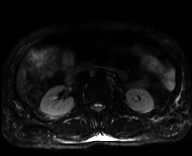
[im 74/111]
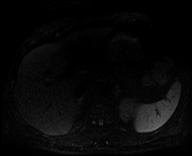
[im 111/111]
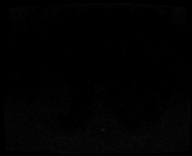

[Series 14: ax dwi_adc · axial · 6.0mm · 1.88mm/px · 1 of 37 slices shown]
[im 1/37]
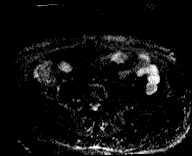

[Series 15: T1 dynamic fat-sat · axial · non-contrast · 2.5mm · 0.74mm/px · z∈[-103,+135]mm · 3 of 96 slices shown (1 of 4)]
[im 1/96]
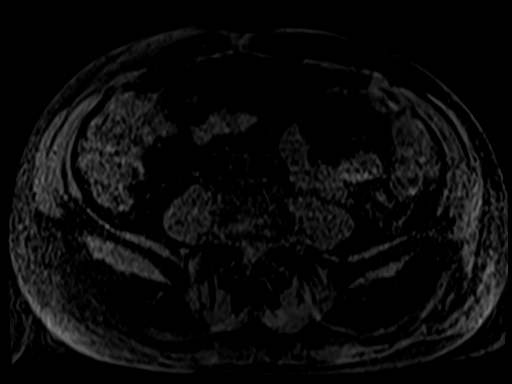
[im 48/96]
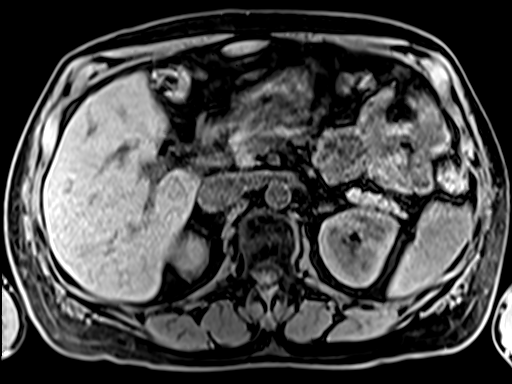
[im 96/96]
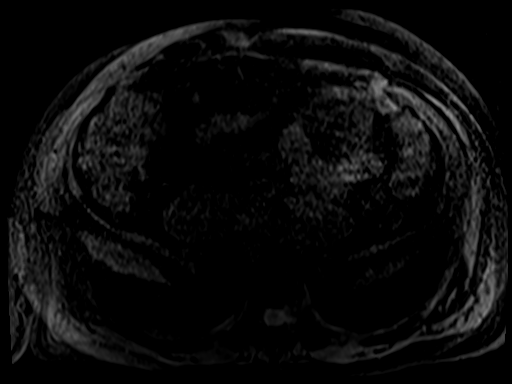

[Series 16: T1 dynamic fat-sat · axial · 2.5mm · 0.74mm/px · z∈[-103,+135]mm · 3 of 96 slices shown (2 of 4)]
[im 1/96]
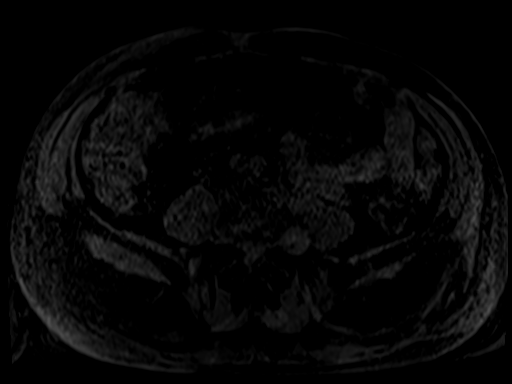
[im 48/96]
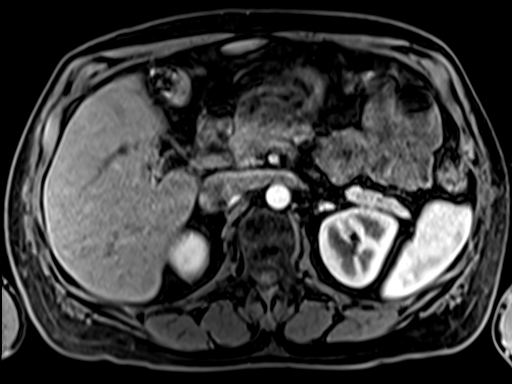
[im 96/96]
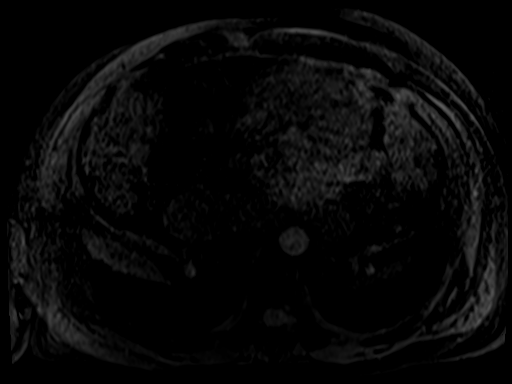

[Series 17: T1 dynamic fat-sat · axial · 2.5mm · 0.74mm/px · z∈[-103,+135]mm · 3 of 96 slices shown (3 of 4)]
[im 1/96]
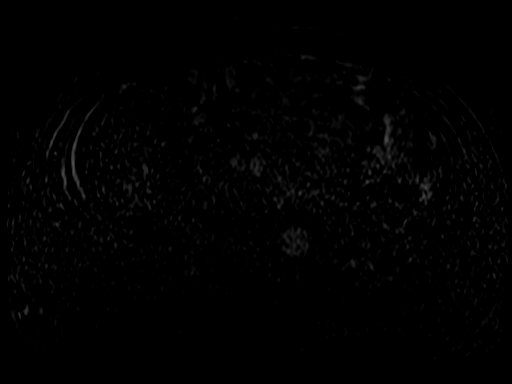
[im 48/96]
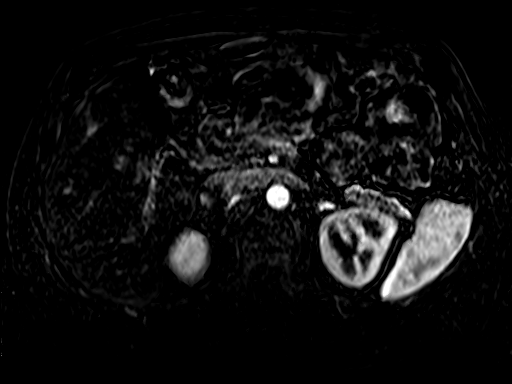
[im 96/96]
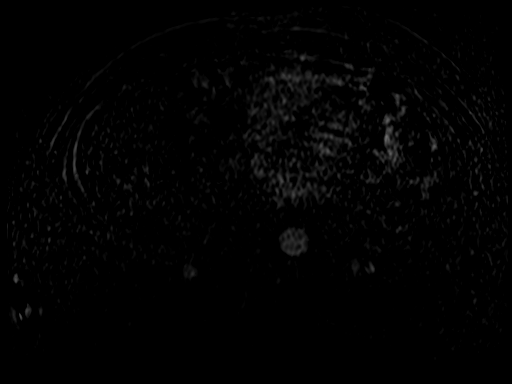

[Series 18: T1 dynamic fat-sat post-contrast · axial · 2.5mm · 0.74mm/px · z∈[-103,+135]mm · 3 of 96 slices shown (1 of 2)]
[im 1/96]
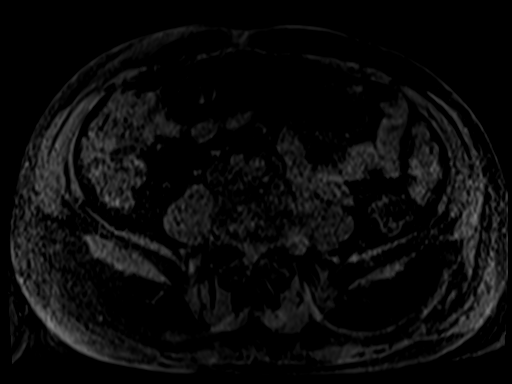
[im 48/96]
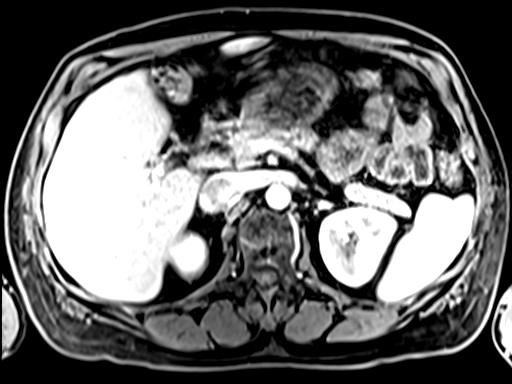
[im 96/96]
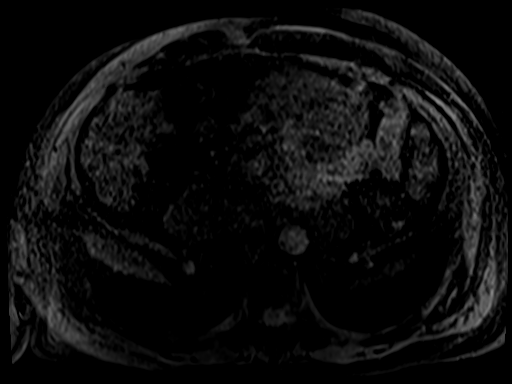

[Series 19: T1 dynamic fat-sat · axial · 2.5mm · 0.74mm/px · z∈[-103,+135]mm · 3 of 96 slices shown (4 of 4)]
[im 1/96]
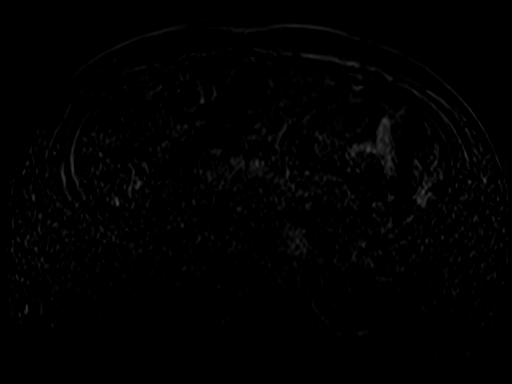
[im 48/96]
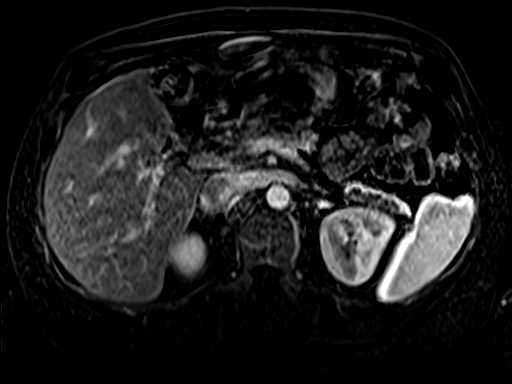
[im 96/96]
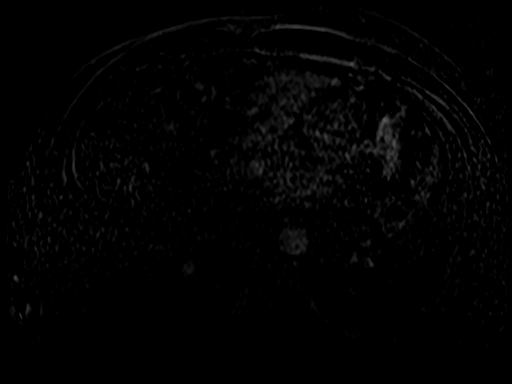

[Series 20: T1 dynamic fat-sat post-contrast · axial · 2.5mm · 0.74mm/px · 1 of 96 slices shown (2 of 2)]
[im 1/96]
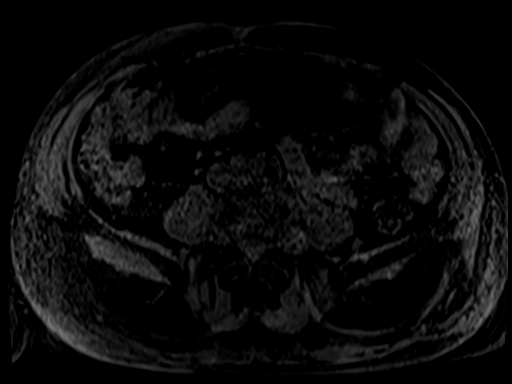

[29 of 48 positions shown; findings below may reference images not displayed]

FINDINGS: Lower chest: Lung bases are clear.

Hepatobiliary: Liver is within normal limits. Periportal edema and
intrahepatic ductal dilatation in the left hepatic lobe has
resolved. Mild altered perfusion along the falciform ligament. No
suspicious/enhancing hepatic lesions.

Gallbladder is unremarkable. No intrahepatic or extrahepatic ductal
dilatation. No choledocholithiasis is seen.

Pancreas:  Within normal limits.

Spleen:  Within normal limits.

Adrenals/Urinary Tract:  Adrenal glands within normal limits.

8 mm cyst in the medial interpolar right kidney (series 5/image 22).
1.8 cm cyst in the posterior left lower kidney (series 5/image 26).
No hydronephrosis.

Stomach/Bowel: Stomach and visualized bowel are unremarkable.

Vascular/Lymphatic:  No evidence of abdominal aortic aneurysm.

No suspicious abdominal lymphadenopathy.

Other:  No abdominal ascites.

Musculoskeletal: No focal osseous lesions.
IMPRESSION: Liver is within normal limits. Prior periportal edema and mild
intrahepatic ductal dilatation in the left hepatic lobe has
resolved.

No intrahepatic or extrahepatic ductal dilatation. No
choledocholithiasis is seen.

## 2019-01-22 ENCOUNTER — Emergency Department: Payer: BC Managed Care – PPO

## 2019-01-22 ENCOUNTER — Other Ambulatory Visit: Payer: Self-pay

## 2019-01-22 ENCOUNTER — Encounter: Payer: Self-pay | Admitting: Emergency Medicine

## 2019-01-22 ENCOUNTER — Emergency Department
Admission: EM | Admit: 2019-01-22 | Discharge: 2019-01-22 | Disposition: A | Discharge status: Home / Self Care | Payer: BC Managed Care – PPO | Attending: Student | Admitting: Student

## 2019-01-22 DIAGNOSIS — R519 Headache, unspecified: Secondary | ICD-10-CM | POA: Diagnosis present

## 2019-01-22 DIAGNOSIS — Z79899 Other long term (current) drug therapy: Secondary | ICD-10-CM | POA: Insufficient documentation

## 2019-01-22 DIAGNOSIS — I10 Essential (primary) hypertension: Secondary | ICD-10-CM | POA: Insufficient documentation

## 2019-01-22 LAB — TROPONIN I (HIGH SENSITIVITY)
Troponin I (High Sensitivity): 6 ng/L (ref <18)
Troponin I (High Sensitivity): 4 ng/L (ref <18)

## 2019-01-22 LAB — BASIC METABOLIC PANEL
Anion gap: 9 (ref 5–15)
BUN: 13 mg/dL (ref 6–20)
CO2: 26 mmol/L (ref 22–32)
Calcium: 9.2 mg/dL (ref 8.9–10.3)
Chloride: 102 mmol/L (ref 98–111)
Creatinine, Ser: 1.11 mg/dL (ref 0.61–1.24)
GFR calc Af Amer: >60 mL/min (ref >60)
GFR calc non Af Amer: >60 mL/min (ref >60)
Glucose, Bld: 116 mg/dL — ABNORMAL HIGH (ref 70–99)
Potassium: 4 mmol/L (ref 3.5–5.1)
Sodium: 137 mmol/L (ref 135–145)

## 2019-01-22 LAB — CBC
HCT: 38.8 % — ABNORMAL LOW (ref 39.0–52.0)
Hemoglobin: 12.8 g/dL — ABNORMAL LOW (ref 13.0–17.0)
MCH: 29.2 pg (ref 26.0–34.0)
MCHC: 33 g/dL (ref 30.0–36.0)
MCV: 88.4 fL (ref 80.0–100.0)
Platelets: 356 10*3/uL (ref 150–400)
RBC: 4.39 MIL/uL (ref 4.22–5.81)
RDW: 13 % (ref 11.5–15.5)
WBC: 7.4 10*3/uL (ref 4.0–10.5)
nRBC: 0 % (ref 0.0–0.2)

## 2019-01-22 MED ORDER — ACETAMINOPHEN 500 MG PO TABS
1000.0000 mg | ORAL_TABLET | Freq: Once | ORAL | Status: AC
  Administered 2019-01-22: 1000 mg via ORAL
  Filled 2019-01-22: qty 2

## 2019-01-22 MED ORDER — KETOROLAC TROMETHAMINE 30 MG/ML IJ SOLN
15.0000 mg | Freq: Once | INTRAMUSCULAR | Status: AC
  Administered 2019-01-22: 15 mg via INTRAVENOUS
  Filled 2019-01-22: qty 1

## 2019-01-22 MED ORDER — DIPHENHYDRAMINE HCL 50 MG/ML IJ SOLN
25.0000 mg | Freq: Once | INTRAMUSCULAR | Status: AC
  Administered 2019-01-22: 25 mg via INTRAVENOUS
  Filled 2019-01-22: qty 1

## 2019-01-22 MED ORDER — DEXAMETHASONE SODIUM PHOSPHATE 10 MG/ML IJ SOLN
10.0000 mg | Freq: Once | INTRAMUSCULAR | Status: AC
  Administered 2019-01-22: 10 mg via INTRAVENOUS
  Filled 2019-01-22: qty 1

## 2019-01-22 MED ORDER — SODIUM CHLORIDE 0.9 % IV BOLUS
1000.0000 mL | Freq: Once | INTRAVENOUS | Status: AC
  Administered 2019-01-22: 1000 mL via INTRAVENOUS

## 2019-01-22 MED ORDER — METOCLOPRAMIDE HCL 5 MG/ML IJ SOLN
10.0000 mg | Freq: Once | INTRAMUSCULAR | Status: AC
  Administered 2019-01-22: 13:00:00 10 mg via INTRAVENOUS
  Filled 2019-01-22: qty 2

## 2019-01-22 MED ORDER — SODIUM CHLORIDE 0.9% FLUSH: 3.0000 mL | Freq: Once | INTRAVENOUS | Status: DC

## 2019-01-22 NOTE — ED Triage Notes (Signed)
Pt here with c/o "pounding" headache for the past 2 days now, states he normally watches his bp, but for thanksgiving ate what he wanted and didn't check his pressure. This am, pt checked his pressure and it was 185/104. Photosensitive. States family hx of strokes and heart attacks. Here to be checked to make sure he is okay. Hx of CKD.

## 2019-01-22 NOTE — ED Notes (Signed)
Per provider, okay to stop fluids prior to patient getting entire bolus.

## 2019-01-22 NOTE — ED Notes (Signed)
See triage note  Presents with pounding h/a's  States he developed h/a 2 days ago

## 2019-01-22 NOTE — Discharge Instructions (Addendum)
Thank you for letting us take care of you in the emergency department today.  ° °Please continue to take any regular, prescribed medications.  ° °Please follow up with: °- Your primary care doctor to review your ER visit and follow up on your symptoms.  ° °Please return to the ER for any new or worsening symptoms.  ° °

## 2019-01-22 NOTE — ED Provider Notes (Signed)
[facility] Emergency Department Provider Note  ____________________________________________   First MD Initiated Contact with Patient 01/22/19 1207     (approximate)  I have reviewed the triage vital signs and the nursing notes.  History  Chief Complaint Headache    HPI Willie Good is a 58 y.o. male who presents to the emergency department for a headache x 2 days.  Patient states headache started Saturday evening/Sunday morning.  Headache has gradually worsened over this time period, not thunderclap in description or maximal at onset.  He describes it as a pounding headache.  Primarily left-sided.  Moderate in severity.  Associated with nausea and photophobia.  No radiation.  Headache is constant.  Somewhat improved when he rests in a dark room and after a dose of Tylenol/aspirin at home.  He denies any associated visual changes, weakness, numbness, tingling.  No fevers, neck stiffness, or other infectious symptoms.   Past Medical Hx Past Medical History:  Diagnosis Date  . Allergy   . Essential hypertension   . Sepsis ([place]) 06/22/2017    Problem List Patient Active Problem List   Diagnosis Date Noted  . Decreased renal function 04/28/2018  . Fatigue 04/28/2018  . Preventative health care 04/22/2017  . Essential hypertension 04/05/2015  . Hyperlipidemia 05/10/2014    Past Surgical Hx Past Surgical History:  Procedure Laterality Date  . APPENDECTOMY      Medications Prior to Admission medications   Medication Sig Start Date End Date Taking? Authorizing Provider  acetaminophen (TYLENOL) 325 MG tablet Take 2 tablets (650 mg total) by mouth every 6 (six) hours as needed. 06/21/17   Carrie Mew, MD  lisinopril (PRINIVIL,ZESTRIL) 30 MG tablet Take 1 tablet (30 mg total) by mouth daily. For blood pressure. 05/12/18 05/12/19  Pleas Koch, NP  Multiple Vitamins-Minerals (MULTIVITAMIN WITH MINERALS) tablet Take 1 tablet by mouth daily.     [provider]  NON FORMULARY [facility]  ANTIFUNGAL (NAIL)- #1    [provider]  Omega-3 Fatty Acids (FISH OIL PO) Take 2,000 mg by mouth daily.     [provider]  rosuvastatin (CRESTOR) 10 MG tablet Take 1 tablet (10 mg total) by mouth daily. For cholesterol. 04/28/18   Pleas Koch, NP    Allergies Patient has no known allergies.  Family Hx Family History  Problem Relation Age of Onset  . Breast cancer Mother   . Diabetes Father   . Heart disease Father   . Hyperlipidemia Father   . Hypertension Father   . Diabetes Sister   . Cancer Maternal Grandmother   . Cancer Maternal Grandfather   . Cancer Paternal Grandmother   . Diabetes Paternal Grandfather   . Heart disease Paternal Grandfather   . Hyperlipidemia Paternal Grandfather   . Hypertension Paternal Grandfather   . Stroke Paternal Grandfather     Social Hx Social History   Tobacco Use  . Smoking status: Never Smoker  . Smokeless tobacco: Never Used  Substance Use Topics  . Alcohol use: Yes    Alcohol/week: 1.0 standard drinks    Types: 1 Cans of beer per week  . Drug use: No     Review of Systems  Constitutional: Negative for fever, chills. Eyes: Negative for visual changes. ENT: Negative for sore throat. Cardiovascular: Negative for chest pain. Respiratory: Negative for shortness of breath. Gastrointestinal: Negative for nausea, vomiting.  Genitourinary: Negative for dysuria. Musculoskeletal: Negative for leg swelling. Skin: Negative for rash. Neurological: + for  for headaches.   Physical Exam  Vital Signs: ED Triage Vitals [01/22/19 1007]  Enc Vitals Group     BP (!) 162/76     Pulse Rate 60     Resp 16     Temp 98.6 F (37 C)     Temp Source Oral     SpO2 100 %     Weight      Height      Head Circumference      Peak Flow      Pain Score      Pain Loc      Pain Edu?      Excl. in [place]?     Constitutional: Alert and oriented.  Head:  Normocephalic. Atraumatic. Eyes: Conjunctivae clear. Sclera anicteric. Nose: No congestion. No rhinorrhea. Mouth/Throat: Wearing mask.  Neck: No stridor.   Cardiovascular: Normal rate, regular rhythm. Extremities well perfused. Respiratory: Normal respiratory effort.  Lungs CTAB. Gastrointestinal: Soft. Non-tender. Non-distended.  Musculoskeletal: No lower extremity edema. No deformities. Neurologic:  Normal speech and language. No gross focal neurologic deficits are appreciated. Alert and oriented.  Face symmetric.  Tongue midline.  Cranial nerves II through XII intact. UE and LE strength 5/5 and symmetric. UE and LE SILT.  Skin: Skin is warm, dry and intact. No rash noted. Psychiatric: Mood and affect are appropriate for situation.  EKG  Personally reviewed.   Rate: 57 Rhythm: sinus Axis: normal Intervals: WNL Sinus bradycardia, no acute ischemic changes No STEMI    Radiology  CT head: IMPRESSION:  1. No acute intracranial abnormality.  2. Ethmoid sinus disease.    Procedures  Procedure(s) performed (including critical care):  Procedures   Initial Impression / Assessment and Plan / ED Course  58 y.o. male who presents to the ED for pounding headache, as above.  Ddx: presentation most consistent with migraine, also consider tension headache.  Not thunderclap in description, not maximal intensity at onset, doubt SAH.  No associated neurological symptoms, doubt CVA.  No fevers, infectious symptoms, meningismus, doubt meningitis.  Work-up initiated in triage.  CT head negative for any acute abnormalities.  EKG sinus bradycardia, no acute ischemic changes.  Troponin negative x 2.  Remainder of blood work without actionable derangements.  We will plan for symptomatic treatment, reassess, anticipate discharge pending improvement in headache.  Patient reports complete resolution in his headache after medications, feels comfortable with discharge at this time.  Work-up  negative.  As such, will proceed with discharge, advised supportive care and PCP follow-up.  Given return precautions.   Final Clinical Impression(s) / ED Diagnosis  Final diagnoses:  Pounding headache       Note:  This document was prepared using Dragon voice recognition software and may include unintentional dictation errors.   Lilia Pro., MD 01/22/19 [phone]

## 2019-02-10 ENCOUNTER — Other Ambulatory Visit: Payer: Self-pay | Admitting: Primary Care

## 2019-02-10 DIAGNOSIS — I10 Essential (primary) hypertension: Secondary | ICD-10-CM

## 2019-04-17 ENCOUNTER — Other Ambulatory Visit: Payer: Self-pay | Admitting: Primary Care

## 2019-04-17 DIAGNOSIS — E785 Hyperlipidemia, unspecified: Secondary | ICD-10-CM

## 2019-04-26 ENCOUNTER — Other Ambulatory Visit: Payer: Self-pay | Admitting: Primary Care

## 2019-04-26 DIAGNOSIS — I10 Essential (primary) hypertension: Secondary | ICD-10-CM

## 2019-04-26 NOTE — Telephone Encounter (Signed)
Patient called to schedule follow up visit for the 19th.  He stated this is the only time he could schedule due to work schedule.  Patient would like to know if he could get enough medication to make it until that appt

## 2019-04-27 NOTE — Telephone Encounter (Signed)
Rx sent through e-[facility]  

## 2019-05-11 ENCOUNTER — Other Ambulatory Visit (INDEPENDENT_AMBULATORY_CARE_PROVIDER_SITE_OTHER): Payer: BC Managed Care – PPO

## 2019-05-11 ENCOUNTER — Encounter: Payer: Self-pay | Admitting: Primary Care

## 2019-05-11 ENCOUNTER — Other Ambulatory Visit: Payer: Self-pay

## 2019-05-11 ENCOUNTER — Telehealth (INDEPENDENT_AMBULATORY_CARE_PROVIDER_SITE_OTHER): Payer: BC Managed Care – PPO | Admitting: Primary Care

## 2019-05-11 VITALS — BP 98/61 | HR 58

## 2019-05-11 DIAGNOSIS — E785 Hyperlipidemia, unspecified: Secondary | ICD-10-CM

## 2019-05-11 DIAGNOSIS — I10 Essential (primary) hypertension: Secondary | ICD-10-CM

## 2019-05-11 DIAGNOSIS — N289 Disorder of kidney and ureter, unspecified: Secondary | ICD-10-CM | POA: Diagnosis not present

## 2019-05-11 LAB — POC URINALSYSI DIPSTICK (AUTOMATED)
Bilirubin, UA: NEGATIVE
Blood, UA: NEGATIVE
Glucose, UA: NEGATIVE
Ketones, UA: NEGATIVE
Leukocytes, UA: NEGATIVE
Nitrite, UA: NEGATIVE
Protein, UA: NEGATIVE
Spec Grav, UA: 1.015 (ref 1.010–1.025)
Urobilinogen, UA: 0.2 E.U./dL
pH, UA: 7.5 (ref 5.0–8.0)

## 2019-05-11 MED ORDER — LISINOPRIL 30 MG PO TABS: 30.0000 mg | ORAL_TABLET | Freq: Every day | ORAL | 3 refills | Status: DC

## 2019-05-11 NOTE — Progress Notes (Signed)
Subjective:    Patient ID: Willie Good, male    DOB: 04-Aug-1960, 59 y.o.   MRN: [id number]  HPI  Virtual Visit via Video Note  I connected with Willie Good on 05/11/19 at  3:20 PM EDT by a video enabled telemedicine application and verified that I am speaking with the correct person using two identifiers.  Location: Patient: Home Provider: Office   I discussed the limitations of evaluation and management by telemedicine and the availability of in person appointments. The patient expressed understanding and agreed to proceed.  History of Present Illness:  Willie Good is a 59 year old male with a history of hypertension, hyperlipidemia, decreased renal function who presents today for follow up of chronic conditions.  Currently managed on lisinopril 30 mg for hypertension and rosuvastatin 10 mg for hyperlipidemia.   His last lipid panel was in late April 2020 with LDL of 157. He has not been taking rosuvastatin due to symptoms of leg tingling/cramping, stopped it about 6 months ago.  He is compliant to his lisinopril 30 mg daily. His blood pressure is checked daily at home which typically runs 110's/60's-70's. HR runs 60's. He denies dizziness, chest pain, headaches.    Observations/Objective:  Alert and oriented. Appears well, not sickly. No distress. Speaking in complete sentences.   Assessment and Plan:  See problem based charting.  Follow Up Instructions:  Schedule a lab appointment as discussed to complete lab work.   It was a pleasure to see you today! Allie Bossier, NP-C    I discussed the assessment and treatment plan with the patient. The patient was provided an opportunity to ask questions and all were answered. The patient agreed with the plan and demonstrated an understanding of the instructions.   The patient was advised to call back or seek an in-person evaluation if the symptoms worsen or if the condition fails to improve as anticipated.    Pleas Koch, NP    Review of Systems  Eyes: Negative for visual disturbance.  Respiratory: Negative for shortness of breath.   Cardiovascular: Negative for chest pain.  Neurological: Negative for dizziness and headaches.       Past Medical History:  Diagnosis Date  . Allergy   . Essential hypertension   . Sepsis ([place]) 06/22/2017     Social History   Socioeconomic History  . Marital status: Married    Spouse name: Not on file  . Number of children: Not on file  . Years of education: Not on file  . Highest education level: Not on file  Occupational History  . Occupation: Truck Geophysicist/field seismologist  Tobacco Use  . Smoking status: Never Smoker  . Smokeless tobacco: Never Used  Substance and Sexual Activity  . Alcohol use: Yes    Alcohol/week: 1.0 standard drinks    Types: 1 Cans of beer per week  . Drug use: No  . Sexual activity: Not Currently  Other Topics Concern  . Not on file  Social History Narrative   Married.   3 children.   Works for [employer].   Enjoys spending time with family, going to [employer], bike riding.   Social Determinants of Health   Financial Resource Strain:   . Difficulty of Paying Living Expenses:   Food Insecurity:   . Worried About Charity fundraiser in the Last Year:   . Arboriculturist in the Last Year:   Transportation Needs:   . Film/video editor (Medical):   Marland Kitchen  Lack of Transportation (Non-Medical):   Physical Activity:   . Days of Exercise per Week:   . Minutes of Exercise per Session:   Stress:   . Feeling of Stress :   Social Connections:   . Frequency of Communication with Friends and Family:   . Frequency of Social Gatherings with Friends and Family:   . Attends Religious Services:   . Active Member of Clubs or Organizations:   . Attends Archivist Meetings:   Marland Kitchen Marital Status:   Intimate Partner Violence:   . Fear of Current or Ex-Partner:   . Emotionally Abused:   Marland Kitchen Physically Abused:   . Sexually Abused:     Past  Surgical History:  Procedure Laterality Date  . APPENDECTOMY      Family History  Problem Relation Age of Onset  . Breast cancer Mother   . Diabetes Father   . Heart disease Father   . Hyperlipidemia Father   . Hypertension Father   . Diabetes Sister   . Cancer Maternal Grandmother   . Cancer Maternal Grandfather   . Cancer Paternal Grandmother   . Diabetes Paternal Grandfather   . Heart disease Paternal Grandfather   . Hyperlipidemia Paternal Grandfather   . Hypertension Paternal Grandfather   . Stroke Paternal Grandfather     No Known Allergies  Current Outpatient Medications on File Prior to Visit  Medication Sig Dispense Refill  . acetaminophen (TYLENOL) 325 MG tablet Take 2 tablets (650 mg total) by mouth every 6 (six) hours as needed. 60 tablet 0  . lisinopril (ZESTRIL) 30 MG tablet TAKE 1 TABLET (30 MG TOTAL) BY MOUTH DAILY. FOR BLOOD PRESSURE. 30 tablet 0  . Multiple Vitamins-Minerals (MULTIVITAMIN WITH MINERALS) tablet Take 1 tablet by mouth daily.    . NON FORMULARY [facility]  ANTIFUNGAL (NAIL)- #1    . Omega-3 Fatty Acids (FISH OIL PO) Take 2,000 mg by mouth daily.     . rosuvastatin (CRESTOR) 10 MG tablet Take 1 tablet (10 mg total) by mouth daily. For cholesterol NEED APPOINTMENT FOR ANY MORE REFILLS 90 tablet 0   No current facility-administered medications on file prior to visit.    BP 98/61   Pulse (!) 58    Objective:   Physical Exam  Constitutional: He is oriented to person, place, and time. He appears well-nourished.  Respiratory: Effort normal.  Neurological: He is alert and oriented to person, place, and time.  Psychiatric: He has a normal mood and affect.           Assessment & Plan:

## 2019-05-11 NOTE — Addendum Note (Signed)
Addended by: Jacqualin Combes on: 05/11/2019 04:24 PM   Modules accepted: Orders

## 2019-05-11 NOTE — Addendum Note (Signed)
Addended by: Ellamae Sia on: 05/11/2019 03:52 PM   Modules accepted: Orders

## 2019-05-11 NOTE — Assessment & Plan Note (Signed)
Stable on current regimen. Continue lisinopril 30 mg.  CMP pending.

## 2019-05-11 NOTE — Assessment & Plan Note (Signed)
Regular exercise, healthy diet in general. Stopped rosuvastatin 6+ months ago.  Repeat lipid panel pending.

## 2019-05-11 NOTE — Patient Instructions (Signed)
Schedule a lab appointment as discussed to complete lab work.   It was a pleasure to see you today! Allie Bossier, NP-C

## 2019-05-11 NOTE — Assessment & Plan Note (Signed)
Normal function on labs from November 2020, repeat renal function pending.

## 2019-05-12 LAB — LIPID PANEL
Cholesterol: 211 mg/dL — ABNORMAL HIGH (ref <200)
HDL: 41 mg/dL (ref >=40)
LDL Cholesterol (Calc): 145 mg/dL (calc) — ABNORMAL HIGH
Non-HDL Cholesterol (Calc): 170 mg/dL (calc) — ABNORMAL HIGH (ref <130)
Total CHOL/HDL Ratio: 5.1 (calc) — ABNORMAL HIGH (ref <5.0)
Triglycerides: 130 mg/dL (ref <150)

## 2019-05-12 LAB — COMPREHENSIVE METABOLIC PANEL
AG Ratio: 2.3 (calc) (ref 1.0–2.5)
ALT: 15 U/L (ref 9–46)
AST: 22 U/L (ref 10–35)
Albumin: 4.5 g/dL (ref 3.6–5.1)
Alkaline phosphatase (APISO): 81 U/L (ref 35–144)
BUN/Creatinine Ratio: 21 (calc) (ref 6–22)
BUN: 30 mg/dL — ABNORMAL HIGH (ref 7–25)
CO2: 28 mmol/L (ref 20–32)
Calcium: 9.4 mg/dL (ref 8.6–10.3)
Chloride: 99 mmol/L (ref 98–110)
Creat: 1.42 mg/dL — ABNORMAL HIGH (ref 0.70–1.33)
Globulin: 2 g/dL (calc) (ref 1.9–3.7)
Glucose, Bld: 81 mg/dL (ref 65–99)
Potassium: 4.3 mmol/L (ref 3.5–5.3)
Sodium: 136 mmol/L (ref 135–146)
Total Bilirubin: 0.4 mg/dL (ref 0.2–1.2)
Total Protein: 6.5 g/dL (ref 6.1–8.1)

## 2019-07-09 ENCOUNTER — Other Ambulatory Visit: Payer: Self-pay | Admitting: Primary Care

## 2019-07-09 DIAGNOSIS — E785 Hyperlipidemia, unspecified: Secondary | ICD-10-CM

## 2019-07-24 ENCOUNTER — Other Ambulatory Visit: Payer: Self-pay

## 2019-07-24 DIAGNOSIS — I10 Essential (primary) hypertension: Secondary | ICD-10-CM

## 2019-07-24 NOTE — Telephone Encounter (Signed)
Spoken to [facility] and was able to refill earlier due to lost bottle. Message left for patient to return my call.

## 2019-07-24 NOTE — Telephone Encounter (Signed)
Please contact the pharmacy and notify them of the situation. I can send a separate (new rx) for the amount needed until he can refill for a 3 month supply, that should be coming up soon. Let me know.

## 2019-07-24 NOTE — Telephone Encounter (Signed)
Patient contacted the office and stated that he went to the beach for a week, and he just got back home today. He states that he accidentally left his Lisinopril at the beach, and is needing a refill [facility] [place]. Is this ok?

## 2020-03-21 ENCOUNTER — Other Ambulatory Visit: Payer: Self-pay | Admitting: Primary Care

## 2020-03-21 DIAGNOSIS — I10 Essential (primary) hypertension: Secondary | ICD-10-CM

## 2020-03-21 NOTE — Telephone Encounter (Signed)
Need to call patient should have had lab appointment 6 months ago. Need to make sure he is taking meds

## 2020-03-21 NOTE — Telephone Encounter (Signed)
Called patient states that he was just seen by [facility]. He will send our office copy of labs in my chart message. Let him know we have not seen in office in long time. He states that he has been taking blood pressure but would like to stop taking for a few weeks and see if his blood pressure is ok with out it. He also did not want to make appointment at this time. Has child is getting ready to schedule heart surgery and did not want to use any time off at this time. Would like to call when he knows this son surgery and make when he schedules to be off.   Patient would like to know if you are ok with him stopping his bp meds and keeping an eye on it for a few weeks to see if it says low. When he went to [facility] his reading was 116/60.

## 2020-03-24 NOTE — Telephone Encounter (Signed)
I do not recommend he discontinue his blood pressure medication without an evaluation by myself. I also need to see him in order to continue refilling medications. He will need to be seen by mid March 2022.

## 2020-03-24 NOTE — Telephone Encounter (Signed)
Called reviewed with patient. He will call and make app. He was informed if no follow in mach no refill. Let him know we did get labs and have sent to you for review. Will continue medications.

## 2020-03-24 NOTE — Telephone Encounter (Signed)
Noted, refill sent to pharmacy. 

## 2020-05-21 ENCOUNTER — Telehealth: Payer: Self-pay

## 2020-05-21 NOTE — Telephone Encounter (Signed)
error 

## 2020-05-21 NOTE — Telephone Encounter (Signed)
Patient is scheduled for a follow up on 4/13, and he states he believes he will be out of medication,lisinopril (ZESTRIL) 30 MG tablet by next week but is going home to count how many pills he will have left and give [place] a call back with the amount he has left.

## 2020-05-22 NOTE — Telephone Encounter (Signed)
Called patient has supply to last until his appointment. Did not need refill at this time.

## 2020-06-04 ENCOUNTER — Telehealth: Payer: BC Managed Care – PPO | Admitting: Primary Care

## 2020-06-04 ENCOUNTER — Other Ambulatory Visit: Payer: Self-pay

## 2020-06-04 DIAGNOSIS — I10 Essential (primary) hypertension: Secondary | ICD-10-CM

## 2020-06-04 MED ORDER — LISINOPRIL 30 MG PO TABS: 30.0000 mg | ORAL_TABLET | Freq: Every day | ORAL | 0 refills | Status: DC

## 2020-07-18 ENCOUNTER — Other Ambulatory Visit: Payer: Self-pay

## 2020-07-18 ENCOUNTER — Encounter: Payer: Self-pay | Admitting: Primary Care

## 2020-07-18 ENCOUNTER — Ambulatory Visit: Payer: BC Managed Care – PPO | Admitting: Primary Care

## 2020-07-18 VITALS — BP 118/82 | Temp 97.8°F | Ht 73.0 in | Wt 195.0 lb

## 2020-07-18 DIAGNOSIS — H539 Unspecified visual disturbance: Secondary | ICD-10-CM | POA: Diagnosis not present

## 2020-07-18 DIAGNOSIS — E785 Hyperlipidemia, unspecified: Secondary | ICD-10-CM | POA: Diagnosis not present

## 2020-07-18 DIAGNOSIS — Z125 Encounter for screening for malignant neoplasm of prostate: Secondary | ICD-10-CM | POA: Diagnosis not present

## 2020-07-18 DIAGNOSIS — I10 Essential (primary) hypertension: Secondary | ICD-10-CM | POA: Diagnosis not present

## 2020-07-18 DIAGNOSIS — Z0000 Encounter for general adult medical examination without abnormal findings: Secondary | ICD-10-CM

## 2020-07-18 LAB — TSH: TSH: 2.78 u[IU]/mL (ref 0.35–4.50)

## 2020-07-18 LAB — LIPID PANEL
Cholesterol: 223 mg/dL — ABNORMAL HIGH (ref 0–200)
HDL: 50.2 mg/dL (ref >39.00)
LDL Cholesterol: 165 mg/dL — ABNORMAL HIGH (ref 0–99)
NonHDL: 172.87
Total CHOL/HDL Ratio: 4
Triglycerides: 41 mg/dL (ref 0.0–149.0)
VLDL: 8.2 mg/dL (ref 0.0–40.0)

## 2020-07-18 LAB — COMPREHENSIVE METABOLIC PANEL
ALT: 13 U/L (ref 0–53)
AST: 22 U/L (ref 0–37)
Albumin: 4.2 g/dL (ref 3.5–5.2)
Alkaline Phosphatase: 57 U/L (ref 39–117)
BUN: 28 mg/dL — ABNORMAL HIGH (ref 6–23)
CO2: 27 mEq/L (ref 19–32)
Calcium: 9.4 mg/dL (ref 8.4–10.5)
Chloride: 100 mEq/L (ref 96–112)
Creatinine, Ser: 1.33 mg/dL (ref 0.40–1.50)
GFR: 58.1 mL/min — ABNORMAL LOW (ref >60.00)
Glucose, Bld: 76 mg/dL (ref 70–99)
Potassium: 4.4 mEq/L (ref 3.5–5.1)
Sodium: 135 mEq/L (ref 135–145)
Total Bilirubin: 0.7 mg/dL (ref 0.2–1.2)
Total Protein: 6.3 g/dL (ref 6.0–8.3)

## 2020-07-18 LAB — CBC
HCT: 39.2 % (ref 39.0–52.0)
Hemoglobin: 13.4 g/dL (ref 13.0–17.0)
MCHC: 34.2 g/dL (ref 30.0–36.0)
MCV: 85.9 fl (ref 78.0–100.0)
Platelets: 359 10*3/uL (ref 150.0–400.0)
RBC: 4.57 Mil/uL (ref 4.22–5.81)
RDW: 13.7 % (ref 11.5–15.5)
WBC: 5.9 10*3/uL (ref 4.0–10.5)

## 2020-07-18 LAB — PSA: PSA: 1.61 ng/mL (ref 0.10–4.00)

## 2020-07-18 NOTE — Assessment & Plan Note (Signed)
Acute and occurring last week for a few minutes, evaluated by paramedics. No episode since.  Agree to increase calorie intake and start some carbs during the day. Continue to monitor BP, discussed to notify if BP runs at or below 100/60 with symptoms.  Exam today unremarkable.  Labs pending

## 2020-07-18 NOTE — Progress Notes (Signed)
Subjective:    Patient ID: Willie Good, male    DOB: 03/07/1960, 60 y.o.   MRN: [id number]  HPI  Willie Good is a very pleasant 60 y.o. male who presents today for complete physical and follow up of conditions.  He would also like to discuss an event that occurred last week. He was driving, he noticed blurred vision, he exited and stopped, felt better after a few minutes, ate a sandwich for which he doesn't do. He is eating one meal per day, no carbs, about 600-800 calories daily. He called paramedics who completed ECG and vitals, was told to start a few carbs during the day. He endorses at least 64 ounces of water daily. He's had no episode since. He monitors his BP at home which runs 110's/70's-80's. He does follow with his eye doctor annually.   Immunizations: -Tetanus: Declines  -Influenza: Declines  -Covid-19: Declines  -Shingles: Declines   Diet: Fair diet.  Exercise: No regular exercise.  Eye exam: Completes annually  Dental exam: Needs to schedule   Colonoscopy: 2019, due 2029 PSA: Due  BP Readings from Last 3 Encounters:  07/18/20 118/82  05/11/19 98/61  01/22/19 (!) 157/92      Review of Systems  Constitutional: Negative for unexpected weight change.  HENT: Negative for rhinorrhea.   Eyes: Negative for visual disturbance.       See HPI  Respiratory: Negative for cough and shortness of breath.   Cardiovascular: Negative for chest pain.  Gastrointestinal: Negative for constipation and diarrhea.  Genitourinary: Negative for difficulty urinating.  Musculoskeletal: Negative for arthralgias.  Skin: Negative for rash.  Allergic/Immunologic: Negative for environmental allergies.  Neurological: Negative for dizziness, numbness and headaches.  Psychiatric/Behavioral: The patient is not nervous/anxious.          Past Medical History:  Diagnosis Date  . Allergy   . Essential hypertension   . Sepsis ([place]) 06/22/2017    Social History   Socioeconomic  History  . Marital status: Married    Spouse name: Not on file  . Number of children: Not on file  . Years of education: Not on file  . Highest education level: Not on file  Occupational History  . Occupation: Truck Geophysicist/field seismologist  Tobacco Use  . Smoking status: Never Smoker  . Smokeless tobacco: Never Used  Vaping Use  . Vaping Use: Never used  Substance and Sexual Activity  . Alcohol use: Yes    Alcohol/week: 1.0 standard drink    Types: 1 Cans of beer per week  . Drug use: No  . Sexual activity: Not Currently  Other Topics Concern  . Not on file  Social History Narrative   Married.   3 children.   Works for [employer].   Enjoys spending time with family, going to [employer], bike riding.   Social Determinants of Health   Financial Resource Strain: Not on file  Food Insecurity: Not on file  Transportation Needs: Not on file  Physical Activity: Not on file  Stress: Not on file  Social Connections: Not on file  Intimate Partner Violence: Not on file    Past Surgical History:  Procedure Laterality Date  . APPENDECTOMY      Family History  Problem Relation Age of Onset  . Breast cancer Mother   . Diabetes Father   . Heart disease Father   . Hyperlipidemia Father   . Hypertension Father   . Diabetes Sister   . Cancer Maternal Grandmother   .  Cancer Maternal Grandfather   . Cancer Paternal Grandmother   . Diabetes Paternal Grandfather   . Heart disease Paternal Grandfather   . Hyperlipidemia Paternal Grandfather   . Hypertension Paternal Grandfather   . Stroke Paternal Grandfather     No Known Allergies  Current Outpatient Medications on File Prior to Visit  Medication Sig Dispense Refill  . acetaminophen (TYLENOL) 325 MG tablet Take 2 tablets (650 mg total) by mouth every 6 (six) hours as needed. 60 tablet 0  . lisinopril (ZESTRIL) 30 MG tablet Take 1 tablet (30 mg total) by mouth daily. for blood pressure 90 tablet 0  . Multiple Vitamins-Minerals (MULTIVITAMIN WITH  MINERALS) tablet Take 1 tablet by mouth daily.    . NON FORMULARY [facility]  ANTIFUNGAL (NAIL)- #1    . Omega-3 Fatty Acids (FISH OIL PO) Take 2,000 mg by mouth daily.      No current facility-administered medications on file prior to visit.    BP 118/82   Temp 97.8 F (36.6 C) (Temporal)   Ht 6\' 1"  (1.854 m)   Wt 195 lb (88.5 kg)   BMI 25.73 kg/m  Objective:   Physical Exam HENT:     Right Ear: Tympanic membrane and ear canal normal.     Left Ear: Tympanic membrane and ear canal normal.     Nose: Nose normal.     Right Sinus: No maxillary sinus tenderness or frontal sinus tenderness.     Left Sinus: No maxillary sinus tenderness or frontal sinus tenderness.  Eyes:     Conjunctiva/sclera: Conjunctivae normal.     Pupils: Pupils are equal, round, and reactive to light.  Neck:     Thyroid: No thyromegaly.     Vascular: No carotid bruit.  Cardiovascular:     Rate and Rhythm: Normal rate and regular rhythm.     Heart sounds: Normal heart sounds.  Pulmonary:     Effort: Pulmonary effort is normal.     Breath sounds: Normal breath sounds. No wheezing or rales.  Abdominal:     General: Bowel sounds are normal.     Palpations: Abdomen is soft.     Tenderness: There is no abdominal tenderness.  Musculoskeletal:        General: Normal range of motion.     Cervical back: Neck supple.  Skin:    General: Skin is warm and dry.  Neurological:     Mental Status: He is alert and oriented to person, place, and time.     Cranial Nerves: No cranial nerve deficit.     Deep Tendon Reflexes: Reflexes are normal and symmetric.  Psychiatric:        Mood and Affect: Mood normal.           Assessment & Plan:      This visit occurred during the SARS-CoV-2 public health emergency.  Safety protocols were in place, including screening questions prior to the visit, additional usage of staff PPE, and extensive cleaning of exam room while observing appropriate contact time as  indicated for disinfecting solutions.

## 2020-07-18 NOTE — Assessment & Plan Note (Signed)
No longer on rosuvastatin. Repeat lipids pending.

## 2020-07-18 NOTE — Assessment & Plan Note (Addendum)
Stable in the office today, continue lisinopril 30 mg.  Consider reducing dose to 20 mg if needed based off HPI and acute visual change.  CMP pending.

## 2020-07-18 NOTE — Assessment & Plan Note (Signed)
Declines all immunizations. PSA due and pending. Colonoscopy UTD, due in 2029.  Encouraged a healthy diet and regular exercise. Exam today unremarkable. Labs pending.

## 2020-07-18 NOTE — Patient Instructions (Signed)
Stop by the lab prior to leaving today. I will notify you of your results once received.   It was a pleasure to see you today!   Preventive Care 10-61 Years Old, Male Preventive care refers to lifestyle choices and visits with your health care provider that can promote health and wellness. This includes:  A yearly physical exam. This is also called an annual wellness visit.  Regular dental and eye exams.  Immunizations.  Screening for certain conditions.  Healthy lifestyle choices, such as: ? Eating a healthy diet. ? Getting regular exercise. ? Not using drugs or products that contain nicotine and tobacco. ? Limiting alcohol use. What can I expect for my preventive care visit? Physical exam Your health care provider will check your:  Height and weight. These may be used to calculate your BMI (body mass index). BMI is a measurement that tells if you are at a healthy weight.  Heart rate and blood pressure.  Body temperature.  Skin for abnormal spots. Counseling Your health care provider may ask you questions about your:  Past medical problems.  Family's medical history.  Alcohol, tobacco, and drug use.  Emotional well-being.  Home life and relationship well-being.  Sexual activity.  Diet, exercise, and sleep habits.  Work and work Statistician.  Access to firearms. What immunizations do I need? Vaccines are usually given at various ages, according to a schedule. Your health care provider will recommend vaccines for you based on your age, medical history, and lifestyle or other factors, such as travel or where you work.   What tests do I need? Blood tests  Lipid and cholesterol levels. These may be checked every 5 years, or more often if you are over 40 years old.  Hepatitis C test.  Hepatitis B test. Screening  Lung cancer screening. You may have this screening every year starting at age 40 if you have a 30-pack-year history of smoking and currently smoke  or have quit within the past 15 years.  Prostate cancer screening. Recommendations will vary depending on your family history and other risks.  Genital exam to check for testicular cancer or hernias.  Colorectal cancer screening. ? All adults should have this screening starting at age 62 and continuing until age 33. ? Your health care provider may recommend screening at age 39 if you are at increased risk. ? You will have tests every 1-10 years, depending on your results and the type of screening test.  Diabetes screening. ? This is done by checking your blood sugar (glucose) after you have not eaten for a while (fasting). ? You may have this done every 1-3 years.  STD (sexually transmitted disease) testing, if you are at risk. Follow these instructions at home: Eating and drinking  Eat a diet that includes fresh fruits and vegetables, whole grains, lean protein, and low-fat dairy products.  Take vitamin and mineral supplements as recommended by your health care provider.  Do not drink alcohol if your health care provider tells you not to drink.  If you drink alcohol: ? Limit how much you have to 0-2 drinks a day. ? Be aware of how much alcohol is in your drink. In the [place] one drink equals one 12 oz bottle of beer (355 mL), one 5 oz glass of wine (148 mL), or one 1 oz glass of hard liquor (44 mL).   Lifestyle  Take daily care of your teeth and gums. Brush your teeth every morning and night with fluoride toothpaste. Floss  one time each day.  Stay active. Exercise for at least 30 minutes 5 or more days each week.  Do not use any products that contain nicotine or tobacco, such as cigarettes, e-cigarettes, and chewing tobacco. If you need help quitting, ask your health care provider.  Do not use drugs.  If you are sexually active, practice safe sex. Use a condom or other form of protection to prevent STIs (sexually transmitted infections).  If told by your health care provider,  take low-dose aspirin daily starting at age 41.  Find healthy ways to cope with stress, such as: ? Meditation, yoga, or listening to music. ? Journaling. ? Talking to a trusted person. ? Spending time with friends and family. Safety  Always wear your seat belt while driving or riding in a vehicle.  Do not drive: ? If you have been drinking alcohol. Do not ride with someone who has been drinking. ? When you are tired or distracted. ? While texting.  Wear a helmet and other protective equipment during sports activities.  If you have firearms in your house, make sure you follow all gun safety procedures. What's next?  Go to your health care provider once a year for an annual wellness visit.  Ask your health care provider how often you should have your eyes and teeth checked.  Stay up to date on all vaccines. This information is not intended to replace advice given to you by your health care provider. Make sure you discuss any questions you have with your health care provider. Document Revised: 11/07/2018 Document Reviewed: 02/02/2018 Elsevier Patient Education  2021 [employer].

## 2020-10-16 ENCOUNTER — Telehealth: Payer: Self-pay

## 2020-10-16 NOTE — Telephone Encounter (Addendum)
Patient is calling in stating that he found a big mole while at [facility] and it didn't fall off. So Willie Good states that he went to urgent care while he is there and was told they couldn't do anything so he went to the emergency room where the doctor told him that the mole looked like possible skin cancer.   The ED wasn't able to refer him anywhere but called a dermatologist in [place] and was told next available is going to be October. Emergency doctor told him it wasn't safe to wait that long so now he is wondering what he needs to do. Trennon ended up finding a specialist at [facility] who can see them next Friday. Wanted to know if they need to stay in [place] or if he needs to be seen by [place].

## 2020-10-16 NOTE — Telephone Encounter (Signed)
Called patient he will send picture to you later to day. He was informed about time to dermatology here and [place] beach.

## 2020-10-16 NOTE — Telephone Encounter (Signed)
He didn't mention the mole during his visit in May 2022.  Can he send me a picture of his mole via My Chart so that I can take a look?  Notify patient that dermatology referrals locally are booked out through early next year. He may want to stay in [facility] if he has an appointment scheduled for next week.   I'm happy to look at a picture if he can attach.

## 2020-10-16 NOTE — Telephone Encounter (Signed)
Noted  

## 2020-12-12 ENCOUNTER — Other Ambulatory Visit: Payer: Self-pay | Admitting: Primary Care

## 2020-12-12 DIAGNOSIS — I10 Essential (primary) hypertension: Secondary | ICD-10-CM

## 2021-06-04 ENCOUNTER — Other Ambulatory Visit: Payer: Self-pay | Admitting: Primary Care

## 2021-06-04 DIAGNOSIS — I10 Essential (primary) hypertension: Secondary | ICD-10-CM

## 2021-06-27 ENCOUNTER — Other Ambulatory Visit: Payer: Self-pay | Admitting: Primary Care

## 2021-06-27 DIAGNOSIS — I10 Essential (primary) hypertension: Secondary | ICD-10-CM

## 2021-08-06 ENCOUNTER — Other Ambulatory Visit: Payer: Self-pay

## 2021-08-06 DIAGNOSIS — I10 Essential (primary) hypertension: Secondary | ICD-10-CM

## 2021-08-06 NOTE — Telephone Encounter (Signed)
I spoke with pt; pt said he has 1 1/2 weeks of lisinopril left but pt forgot to call earlier about getting appt for FU with Anda Kraft in June. Pt is leaving in less than one hour for the beach and will not return home until 08/30/21. Pt request 30 day refill of lisinopril 30 mg [facility]  [address] [place] [place].pt has already scheduled appt with Gentry Fitz NP on 09/04/21 at 11:40. Pt request cb when med sent to pharmacy. Sending note to Jannette Spanner NP and Anastasiya CMA. Pt apologized for last min call for refill and appreciative for understanding.

## 2021-08-06 NOTE — Telephone Encounter (Signed)
[facility] Night - Client Nonclinical Telephone Record  [facility] Client [facility] Night - Client Client Site [place] - Night Provider Alma Friendly - NP Contact Type Call Who Is Calling Patient / Member / Family / Caregiver Caller Name [place] Phone Number [phone] Patient Name Willie Good Patient DOB [birth year] Call Type Message Only Information Provided Reason for Call Request to Schedule Office Appointment Initial Comment Caller is taking Lisinopril and needing to schedule an appt to get medication refill. Patient request to speak to RN No Disp. Time Disposition Final User 08/06/2021 7:59:26 AM General Information Provided Yes Kenton Kingfisher, Lanette Call Closed By: Nelia Shi Transaction Date/Time: 08/06/2021 7:57:00 AM (ET

## 2021-08-07 ENCOUNTER — Telehealth: Payer: Self-pay | Admitting: Nurse Practitioner

## 2021-08-07 MED ORDER — LISINOPRIL 30 MG PO TABS: 30.0000 mg | ORAL_TABLET | Freq: Every day | ORAL | 0 refills | Status: DC

## 2021-08-07 NOTE — Telephone Encounter (Signed)
Left message letting patient know information below

## 2021-08-07 NOTE — Telephone Encounter (Signed)
Pt notified refilled sent to [facility] and pt very appreciative.

## 2021-08-07 NOTE — Telephone Encounter (Signed)
I will send in a 30 day supply but there will be no more refills without an office visit. I know he has one scheduled. He requested a phone call to let him know we refilled it

## 2021-08-10 ENCOUNTER — Other Ambulatory Visit: Payer: Self-pay | Admitting: Primary Care

## 2021-08-10 DIAGNOSIS — I10 Essential (primary) hypertension: Secondary | ICD-10-CM

## 2021-09-04 ENCOUNTER — Ambulatory Visit: Payer: BC Managed Care – PPO | Admitting: Primary Care

## 2021-09-04 ENCOUNTER — Telehealth: Payer: Self-pay | Admitting: Primary Care

## 2021-09-04 DIAGNOSIS — I10 Essential (primary) hypertension: Secondary | ICD-10-CM

## 2021-09-04 MED ORDER — LISINOPRIL 30 MG PO TABS: 30.0000 mg | ORAL_TABLET | Freq: Every day | ORAL | 0 refills | Status: DC

## 2021-09-04 NOTE — Telephone Encounter (Signed)
Noted. Will provide refill. We will see him in August as scheduled.

## 2021-09-04 NOTE — Telephone Encounter (Signed)
Pt came in today for apt '@11'$ :40 but had to reschedule for 09/25/2021 due to work, he stated he needs a refill request on medication for his bp. Please advise when possible, thank you so much.  Callback #: [phone]

## 2021-09-07 ENCOUNTER — Other Ambulatory Visit: Payer: Self-pay | Admitting: Nurse Practitioner

## 2021-09-07 DIAGNOSIS — I10 Essential (primary) hypertension: Secondary | ICD-10-CM

## 2021-09-25 ENCOUNTER — Encounter: Payer: Self-pay | Admitting: Primary Care

## 2021-09-25 ENCOUNTER — Ambulatory Visit: Payer: BC Managed Care – PPO | Admitting: Primary Care

## 2021-09-25 VITALS — BP 110/62 | HR 62 | Temp 98.6°F | Ht 73.0 in | Wt 216.0 lb

## 2021-09-25 DIAGNOSIS — E785 Hyperlipidemia, unspecified: Secondary | ICD-10-CM | POA: Diagnosis not present

## 2021-09-25 DIAGNOSIS — I10 Essential (primary) hypertension: Secondary | ICD-10-CM | POA: Diagnosis not present

## 2021-09-25 DIAGNOSIS — Z125 Encounter for screening for malignant neoplasm of prostate: Secondary | ICD-10-CM

## 2021-09-25 LAB — COMPREHENSIVE METABOLIC PANEL
ALT: 17 U/L (ref 0–53)
AST: 21 U/L (ref 0–37)
Albumin: 4.6 g/dL (ref 3.5–5.2)
Alkaline Phosphatase: 64 U/L (ref 39–117)
BUN: 28 mg/dL — ABNORMAL HIGH (ref 6–23)
CO2: 26 mEq/L (ref 19–32)
Calcium: 9.8 mg/dL (ref 8.4–10.5)
Chloride: 100 mEq/L (ref 96–112)
Creatinine, Ser: 1.38 mg/dL (ref 0.40–1.50)
GFR: 55.12 mL/min — ABNORMAL LOW (ref >60.00)
Glucose, Bld: 86 mg/dL (ref 70–99)
Potassium: 4.6 mEq/L (ref 3.5–5.1)
Sodium: 135 mEq/L (ref 135–145)
Total Bilirubin: 0.5 mg/dL (ref 0.2–1.2)
Total Protein: 7.3 g/dL (ref 6.0–8.3)

## 2021-09-25 LAB — LIPID PANEL
Cholesterol: 269 mg/dL — ABNORMAL HIGH (ref 0–200)
HDL: 50.8 mg/dL (ref >39.00)
LDL Cholesterol: 202 mg/dL — ABNORMAL HIGH (ref 0–99)
NonHDL: 218.32
Total CHOL/HDL Ratio: 5
Triglycerides: 83 mg/dL (ref 0.0–149.0)
VLDL: 16.6 mg/dL (ref 0.0–40.0)

## 2021-09-25 LAB — PSA: PSA: 1.38 ng/mL (ref 0.10–4.00)

## 2021-09-25 MED ORDER — LISINOPRIL 20 MG PO TABS: 20.0000 mg | ORAL_TABLET | Freq: Every day | ORAL | 3 refills | Status: DC

## 2021-09-25 NOTE — Patient Instructions (Signed)
Stop by the lab prior to leaving today. I will notify you of your results once received.   We reduced your lisinopril to 20 mg daily. I sent a new prescription to your pharmacy.   Keep monitoring your blood pressure as discussed.   It was a pleasure to see you today!

## 2021-09-25 NOTE — Progress Notes (Signed)
Subjective:    Patient ID: Willie Good, male    DOB: 03/05/1960, 61 y.o.   MRN: [id number]  Hypertension Pertinent negatives include no chest pain, headaches or shortness of breath.  Back Pain Pertinent negatives include no chest pain or headaches.    Willie Good is a very pleasant 61 y.o. male with a history of hypertension, hyperlipidemia, decreased renal function, fatigue who presents today for follow up of chronic conditions.  1) Essential Hypertension: Currently managed on lisinopril 30 mg daily. He is exercising five days weekly, is also active at home during the weekends.  He denies chest pain, dizziness, headaches.   He checks his BP at home once weekly which is running 90's- low 100's/60's.   BP Readings from Last 3 Encounters:  09/25/21 110/62  07/18/20 118/82  05/11/19 98/61    2) Hyperlipidemia: Currently taking Fish Oil daily, not on statin therapy. Due for repeat lipid panel today. He's tried Crestor several years ago which caused myalgias, tingling.     Review of Systems  Constitutional:  Negative for unexpected weight change.  HENT:  Negative for rhinorrhea.   Respiratory:  Negative for cough and shortness of breath.   Cardiovascular:  Negative for chest pain.  Gastrointestinal:  Negative for constipation and diarrhea.  Genitourinary:  Negative for difficulty urinating.  Musculoskeletal:  Negative for arthralgias and myalgias.  Skin:  Negative for rash.  Allergic/Immunologic: Negative for environmental allergies.  Neurological:  Negative for dizziness and headaches.  Psychiatric/Behavioral:  The patient is not nervous/anxious.          Past Medical History:  Diagnosis Date   Allergy    Essential hypertension    Sepsis ([place]) 06/22/2017    Social History   Socioeconomic History   Marital status: Married    Spouse name: Not on file   Number of children: Not on file   Years of education: Not on file   Highest education level: Not on file   Occupational History   Occupation: Truck driver  Tobacco Use   Smoking status: Never   Smokeless tobacco: Never  Vaping Use   Vaping Use: Never used  Substance and Sexual Activity   Alcohol use: Yes    Alcohol/week: 1.0 standard drink of alcohol    Types: 1 Cans of beer per week   Drug use: No   Sexual activity: Not Currently  Other Topics Concern   Not on file  Social History Narrative   Married.   3 children.   Works for [employer].   Enjoys spending time with family, going to [employer], bike riding.   Social Determinants of Health   Financial Resource Strain: Not on file  Food Insecurity: Not on file  Transportation Needs: Not on file  Physical Activity: Not on file  Stress: Not on file  Social Connections: Not on file  Intimate Partner Violence: Not on file    Past Surgical History:  Procedure Laterality Date   APPENDECTOMY      Family History  Problem Relation Age of Onset   Breast cancer Mother    Diabetes Father    Heart disease Father    Hyperlipidemia Father    Hypertension Father    Diabetes Sister    Cancer Maternal Grandmother    Cancer Maternal Grandfather    Cancer Paternal Grandmother    Diabetes Paternal Grandfather    Heart disease Paternal Grandfather    Hyperlipidemia Paternal Grandfather    Hypertension Paternal Merchant navy officer  Stroke Paternal Grandfather     No Known Allergies  Current Outpatient Medications on File Prior to Visit  Medication Sig Dispense Refill   acetaminophen (TYLENOL) 325 MG tablet Take 2 tablets (650 mg total) by mouth every 6 (six) hours as needed. 60 tablet 0   Multiple Vitamins-Minerals (MULTIVITAMIN WITH MINERALS) tablet Take 1 tablet by mouth daily.     Omega-3 Fatty Acids (FISH OIL PO) Take 2,000 mg by mouth daily.      NON FORMULARY [facility]  ANTIFUNGAL (NAIL)- #1 (Patient not taking: Reported on 09/25/2021)     No current facility-administered medications on file prior to visit.    BP 110/62    Pulse 62   Temp 98.6 F (37 C) (Oral)   Ht '6\' 1"'$  (1.854 m)   Wt 216 lb (98 kg)   BMI 28.50 kg/m  Objective:   Physical Exam HENT:     Nose:     Right Sinus: No maxillary sinus tenderness or frontal sinus tenderness.     Left Sinus: No maxillary sinus tenderness or frontal sinus tenderness.  Neck:     Thyroid: No thyromegaly.  Cardiovascular:     Rate and Rhythm: Normal rate and regular rhythm.     Heart sounds: Normal heart sounds.  Pulmonary:     Effort: Pulmonary effort is normal.     Breath sounds: Normal breath sounds. No wheezing or rales.  Abdominal:     General: Bowel sounds are normal.     Palpations: Abdomen is soft.     Tenderness: There is no abdominal tenderness.  Musculoskeletal:        General: Normal range of motion.     Cervical back: Neck supple.  Skin:    General: Skin is warm and dry.  Neurological:     Mental Status: He is alert and oriented to person, place, and time.     Cranial Nerves: No cranial nerve deficit.     Deep Tendon Reflexes: Reflexes are normal and symmetric.  Psychiatric:        Mood and Affect: Mood normal.           Assessment & Plan:   Problem List Items Addressed This Visit       Cardiovascular and Mediastinum   Essential hypertension - Primary    Controlled, lower readings at home.  Reduce lisinopril to 20 mg daily. CMP pending.       Relevant Medications   lisinopril (ZESTRIL) 20 MG tablet   Other Relevant Orders   Comprehensive metabolic panel     Other   Hyperlipidemia    Uncontrolled.  Discussed his prior elevated levels and ASCVD%  Repeat lipid panel pending.  Commended him on lifestyle changes.  He would like to avoid statin therapy if possible. Await results  The 10-year ASCVD risk score (Arnett DK, et al., 2019) is: 7.9%   Values used to calculate the score:     Age: 43 years     Sex: Male     Is Non-Hispanic African American: No     Diabetic: No     Tobacco smoker: No     Systolic Blood  Pressure: 110 mmHg     Is BP treated: No     HDL Cholesterol: 50.2 mg/dL     Total Cholesterol: 223 mg/dL       Relevant Medications   lisinopril (ZESTRIL) 20 MG tablet   Other Relevant Orders   Lipid panel   Other Visit Diagnoses  Screening for prostate cancer       Relevant Orders   PSA          Pleas Koch, NP

## 2021-09-25 NOTE — Assessment & Plan Note (Signed)
Controlled, lower readings at home.  Reduce lisinopril to 20 mg daily. CMP pending.

## 2021-09-25 NOTE — Assessment & Plan Note (Signed)
Uncontrolled.  Discussed his prior elevated levels and ASCVD%  Repeat lipid panel pending.  Commended him on lifestyle changes.  He would like to avoid statin therapy if possible. Await results  The 10-year ASCVD risk score (Arnett DK, et al., 2019) is: 7.9%   Values used to calculate the score:     Age: 61 years     Sex: Male     Is Non-Hispanic African American: No     Diabetic: No     Tobacco smoker: No     Systolic Blood Pressure: 307 mmHg     Is BP treated: No     HDL Cholesterol: 50.2 mg/dL     Total Cholesterol: 223 mg/dL

## 2021-10-17 ENCOUNTER — Other Ambulatory Visit: Payer: Self-pay | Admitting: Primary Care

## 2021-10-17 DIAGNOSIS — I10 Essential (primary) hypertension: Secondary | ICD-10-CM

## 2021-12-12 ENCOUNTER — Emergency Department
Admission: EM | Admit: 2021-12-12 | Discharge: 2021-12-12 | Disposition: A | Discharge status: Home / Self Care | Payer: No Typology Code available for payment source | Attending: Emergency Medicine | Admitting: Emergency Medicine

## 2021-12-12 ENCOUNTER — Other Ambulatory Visit: Payer: Self-pay

## 2021-12-12 ENCOUNTER — Emergency Department: Payer: No Typology Code available for payment source

## 2021-12-12 ENCOUNTER — Encounter: Payer: Self-pay | Admitting: Emergency Medicine

## 2021-12-12 DIAGNOSIS — M25561 Pain in right knee: Secondary | ICD-10-CM | POA: Insufficient documentation

## 2021-12-12 DIAGNOSIS — Y990 Civilian activity done for income or pay: Secondary | ICD-10-CM | POA: Diagnosis not present

## 2021-12-12 DIAGNOSIS — I10 Essential (primary) hypertension: Secondary | ICD-10-CM | POA: Diagnosis not present

## 2021-12-12 DIAGNOSIS — X509XXA Other and unspecified overexertion or strenuous movements or postures, initial encounter: Secondary | ICD-10-CM | POA: Diagnosis not present

## 2021-12-12 MED ORDER — NAPROXEN 500 MG PO TABS
250.0000 mg | ORAL_TABLET | Freq: Once | ORAL | Status: AC
  Administered 2021-12-12: 250 mg via ORAL
  Filled 2021-12-12: qty 1

## 2021-12-12 NOTE — ED Provider Notes (Signed)
[facility] Provider Note    Event Date/Time   First MD Initiated Contact with Patient 12/12/21 1101     (approximate)   History   Knee Injury   HPI  Willie Good is a 61 y.o. male with past medical history of hypertension who presents with knee injury.  Patient works as a Oncologist.  Was getting out of his truck yesterday when he felt a pop in the right knee.  He did not actually have pain initially was able to ambulate.  Then throughout the night started having worsening pain and difficulty ambulating.  She called his work who told him that he could either see his primary doctor on Monday or if things seem to worsen he should go to the emergency department.  He is taking Tylenol for it.  Denies history of other knee pain or injuries.     Past Medical History:  Diagnosis Date   Allergy    Essential hypertension    Sepsis ([place]) 06/22/2017    Patient Active Problem List   Diagnosis Date Noted   Visual changes 07/18/2020   Decreased renal function 04/28/2018   Fatigue 04/28/2018   Preventative health care 04/22/2017   Essential hypertension 04/05/2015   Hyperlipidemia 05/10/2014     Physical Exam  Triage Vital Signs: ED Triage Vitals  Enc Vitals Group     BP 12/12/21 0939 139/75     Pulse Rate 12/12/21 0939 72     Resp 12/12/21 0939 18     Temp 12/12/21 0939 97.9 F (36.6 C)     Temp Source 12/12/21 0939 Oral     SpO2 12/12/21 0939 99 %     Weight 12/12/21 0936 216 lb 0.8 oz (98 kg)     Height 12/12/21 0936 '6\' 1"'$  (1.854 m)     Head Circumference --      Peak Flow --      Pain Score 12/12/21 0936 6     Pain Loc --      Pain Edu? --      Excl. in [place]? --     Most recent vital signs: Vitals:   12/12/21 0939  BP: 139/75  Pulse: 72  Resp: 18  Temp: 97.9 F (36.6 C)  SpO2: 99%     General: Awake, no distress.  CV:  Good peripheral perfusion.  Resp:  Normal effort.  Abd:  No distention.  Neuro:             Awake, Alert,  Oriented x 3  Other:  No significant swelling or effusion of the right knee, there is tenderness along the medial joint line, straight leg raise is intact, no clear valgus or varus laxity, 2+ DP pulse, intact plantarflexion dorsiflexion Patient is able to bear weight   ED Results / Procedures / Treatments  Labs (all labs ordered are listed, but only abnormal results are displayed) Labs Reviewed - No data to display   EKG     RADIOLOGY I reviewed and interpreted the x-ray of the right knee which is negative for fracture   PROCEDURES:  Critical Care performed: No  Procedures    MEDICATIONS ORDERED IN ED: Medications  naproxen (NAPROSYN) tablet 250 mg (has no administration in time range)     IMPRESSION / MDM / ASSESSMENT AND PLAN / ED COURSE  I reviewed the triage vital signs and the nursing notes.  Patient's presentation is most consistent with acute complicated illness / injury requiring diagnostic workup.  Differential diagnosis includes, but is not limited to, sprain of the MCL, meniscal tear, MCL tear, less likely fracture  The patient is a 61 year old male presents with pain after injury to the right knee.  He was getting out of his [employer] truck when he felt a pop in the right knee initially did not have pain but overnight developed worsening pain difficulty ambulating.  His tenderness is really mostly along the medial aspect of the knee.  There is no significant swelling no effusion or deformity.  He has pain with flexion and somewhat limited flexion but straight leg raise is intact he is neurovascularly intact.  Suspect either knee sprain versus ligamentous tear such as MCL.  X-ray was obtained from triage and is negative for fracture.  Patient has a knee brace that he purchased last night at bedside which I think is probably better than putting him in a knee immobilizer at this point.  Discussed RICE and NSAIDs.  Patient did not want any  opiates.  He has a primary doctor appointment on Monday.  Discussed with him that this will be a good time to reassess his symptoms and if his pain is persistent and he has difficulty ambulating he may need an MRI to further assess for significant ligamentous injury.       FINAL CLINICAL IMPRESSION(S) / ED DIAGNOSES   Final diagnoses:  Acute pain of right knee     Rx / DC Orders   ED Discharge Orders     None        Note:  This document was prepared using Dragon voice recognition software and may include unintentional dictation errors.   Rada Hay, MD 12/12/21 [phone]

## 2021-12-12 NOTE — Discharge Instructions (Signed)
Please rest ice the knee and elevate the leg.  Please take naproxen or Aleve twice a day.  Please follow-up with your primary doctor on Monday to further assess if you can go back to work or will need an MRI.

## 2021-12-12 NOTE — ED Notes (Signed)
Patient did not bring any paperwork from employer to indicate need for UDS.

## 2021-12-12 NOTE — ED Triage Notes (Signed)
Right leg injury.  Injured right knee last evening when stepping out of tractor trailer that was up on raised to be towed and the distance to the ground was greater than expected.  States he felt a 'pop' to right knee.  C/O pain to medial right knee.

## 2022-10-20 ENCOUNTER — Other Ambulatory Visit: Payer: Self-pay | Admitting: Primary Care

## 2022-10-20 DIAGNOSIS — I10 Essential (primary) hypertension: Secondary | ICD-10-CM

## 2022-10-20 NOTE — Telephone Encounter (Signed)
Patient is due for CPE/follow up, this will be required prior to any further refills.  Please schedule, thank you!   

## 2022-10-21 NOTE — Telephone Encounter (Signed)
LVM for patient to c/b and schedule.  

## 2022-10-21 NOTE — Telephone Encounter (Signed)
Patient has been scheduled

## 2022-11-08 ENCOUNTER — Ambulatory Visit (INDEPENDENT_AMBULATORY_CARE_PROVIDER_SITE_OTHER): Payer: BC Managed Care – PPO | Admitting: Primary Care

## 2022-11-08 ENCOUNTER — Encounter: Payer: Self-pay | Admitting: Primary Care

## 2022-11-08 VITALS — BP 116/82 | HR 62 | Temp 97.3°F | Ht 73.0 in | Wt 207.0 lb

## 2022-11-08 DIAGNOSIS — N289 Disorder of kidney and ureter, unspecified: Secondary | ICD-10-CM | POA: Diagnosis not present

## 2022-11-08 DIAGNOSIS — E785 Hyperlipidemia, unspecified: Secondary | ICD-10-CM

## 2022-11-08 DIAGNOSIS — I10 Essential (primary) hypertension: Secondary | ICD-10-CM

## 2022-11-08 LAB — COMPREHENSIVE METABOLIC PANEL
ALT: 26 U/L (ref 0–53)
AST: 24 U/L (ref 0–37)
Albumin: 4 g/dL (ref 3.5–5.2)
Alkaline Phosphatase: 62 U/L (ref 39–117)
BUN: 32 mg/dL — ABNORMAL HIGH (ref 6–23)
CO2: 28 meq/L (ref 19–32)
Calcium: 9.2 mg/dL (ref 8.4–10.5)
Chloride: 102 meq/L (ref 96–112)
Creatinine, Ser: 1.11 mg/dL (ref 0.40–1.50)
GFR: 71.01 mL/min (ref >60.00)
Glucose, Bld: 87 mg/dL (ref 70–99)
Potassium: 4.2 meq/L (ref 3.5–5.1)
Sodium: 139 meq/L (ref 135–145)
Total Bilirubin: 0.4 mg/dL (ref 0.2–1.2)
Total Protein: 6.4 g/dL (ref 6.0–8.3)

## 2022-11-08 MED ORDER — LISINOPRIL 10 MG PO TABS: 10.0000 mg | ORAL_TABLET | Freq: Every day | ORAL | 3 refills | Status: AC

## 2022-11-08 NOTE — Progress Notes (Signed)
Subjective:    Patient ID: ODESSA MCFAIL, male    DOB: 12-01-1960, 62 y.o.   MRN: [id number]  HPI  Willie Good is a very pleasant 62 y.o. male who presents today for follow up of chronic conditions.  1) Hypertension: Currently managed on lisinopril 20 mg daily for hypertension. He denies chest pain, dizziness, headaches. He is checking his BP at home which is running 110s/70s.  He would like to wean off of his lisinopril completely given his lifestyle change and regular exercise.  He is feeling better today than he did in his 64s.  Diet currently consists of an all carnivore diet.  Breakfast: Eggs, bacon Lunch: Skips Dinner: Steak or beef only.  Snacks: None Desserts: None Beverages: Water and black coffee  Exercise: Four days weekly of weights, five days weekly of walking.   Wt Readings from Last 3 Encounters:  11/08/22 207 lb (93.9 kg)  12/12/21 216 lb 0.8 oz (98 kg)  09/25/21 216 lb (98 kg)     2) Hyperlipidemia: Chronic history with LDL from 2023 of 202; LDL of 165 in 2022; LDL of 145 in 2021. In 2023 it was recommended he start statin therapy. He did not respond.   He has a family history of heart disease, hypertension, hyperlipidemia. His father also a history of sudden death at the age of 72.   He has a "major physical" scheduled with [facility] who is following his lipid panels.   Immunizations: -Tetanus: Completed > 10 years ago, declines -Influenza: Declines influenza vaccine -Shingles: Never completed, declines   Colonoscopy: Completed in 2019, due 2029  PSA: Completed 6 months ago at [facility].  He will attach results.  BP Readings from Last 3 Encounters:  11/08/22 116/82  12/12/21 139/75  09/25/21 110/62         Review of Systems  Eyes:  Negative for visual disturbance.  Respiratory:  Negative for shortness of breath.   Cardiovascular:  Negative for chest pain.  Gastrointestinal:  Negative for constipation  and diarrhea.  Neurological:  Negative for dizziness and headaches.         Past Medical History:  Diagnosis Date   Allergy    Essential hypertension    Sepsis ([facility]) 06/22/2017    Social History   Socioeconomic History   Marital status: Married    Spouse name: Not on file   Number of children: Not on file   Years of education: Not on file   Highest education level: Not on file  Occupational History   Occupation: Truck driver  Tobacco Use   Smoking status: Never   Smokeless tobacco: Never  Vaping Use   Vaping status: Never Used  Substance and Sexual Activity   Alcohol use: Yes    Alcohol/week: 1.0 standard drink of alcohol    Types: 1 Cans of beer per week   Drug use: No   Sexual activity: Not Currently  Other Topics Concern   Not on file  Social History Narrative   Married.   3 children.   Works for [employer].   Enjoys spending time with family, going to [employer] [place], bike riding.   Social Determinants of Health   Financial Resource Strain: Not on file  Food Insecurity: Not on file  Transportation Needs: Not on file  Physical Activity: Not on file  Stress: Not on file  Social Connections: Not on file  Intimate Partner Violence: Not on file    Past Surgical History:  Procedure Laterality Date   APPENDECTOMY      Family History  Problem Relation Age of Onset   Breast cancer Mother    Diabetes Father    Heart disease Father    Hyperlipidemia Father    Hypertension Father    Diabetes Sister    Cancer Maternal Grandmother    Cancer Maternal Grandfather    Cancer Paternal Grandmother    Diabetes Paternal Grandfather    Heart disease Paternal Grandfather    Hyperlipidemia Paternal Grandfather    Hypertension Paternal Grandfather    Stroke Paternal Grandfather     No Known Allergies  Current Outpatient Medications on File Prior to Visit  Medication Sig Dispense Refill   Omega-3 Fatty Acids (FISH OIL PO) Take 2,000 mg by mouth daily.      acetaminophen  (TYLENOL) 325 MG tablet Take 2 tablets (650 mg total) by mouth every 6 (six) hours as needed. (Patient not taking: Reported on 11/08/2022) 60 tablet 0   Multiple Vitamins-Minerals (MULTIVITAMIN WITH MINERALS) tablet Take 1 tablet by mouth daily. (Patient not taking: Reported on 11/08/2022)     NON FORMULARY [facility]  ANTIFUNGAL (NAIL)- #1 (Patient not taking: Reported on 09/25/2021)     No current facility-administered medications on file prior to visit.    BP 116/82   Pulse 62   Temp (!) 97.3 F (36.3 C) (Temporal)   Ht 6\' 1"  (1.854 m)   Wt 207 lb (93.9 kg)   SpO2 98%   BMI 27.31 kg/m  Objective:   Physical Exam Cardiovascular:     Rate and Rhythm: Normal rate and regular rhythm.  Pulmonary:     Effort: Pulmonary effort is normal.     Breath sounds: Normal breath sounds. No wheezing or rales.  Abdominal:     General: Bowel sounds are normal.     Palpations: Abdomen is soft.     Tenderness: There is no abdominal tenderness.  Musculoskeletal:     Cervical back: Neck supple.  Skin:    General: Skin is warm and dry.  Neurological:     Mental Status: He is alert and oriented to person, place, and time.           Assessment & Plan:  Essential hypertension Assessment & Plan: Controlled.   He would like to try weaning off of lisinopril.  Agree to cut him back to 10 mg daily as he is monitoring closely at home.  He will continue to monitor BP at home and update if readings are consistently at or above 140/90. CMP pending.  Orders: -     Lisinopril; Take 1 tablet (10 mg total) by mouth daily. for blood pressure.  Dispense: 90 tablet; Refill: 3 -     Comprehensive metabolic panel  Hyperlipidemia, unspecified hyperlipidemia type Assessment & Plan: He is aware of his hyperlipidemia.  Declines treatment. He declines lipid panel today as he will complete this at [facility]. I have asked that he attach his results to his MyChart portal once  results are in.       Decreased renal function Assessment & Plan: Repeat renal function pending. Commended him on good water intake and regular exercise.  Continue ACE inhibitor.         Doreene Nest, NP

## 2022-11-08 NOTE — Assessment & Plan Note (Signed)
Repeat renal function pending. Commended him on good water intake and regular exercise.  Continue ACE inhibitor.

## 2022-11-08 NOTE — Assessment & Plan Note (Signed)
Controlled.   He would like to try weaning off of lisinopril.  Agree to cut him back to 10 mg daily as he is monitoring closely at home.  He will continue to monitor BP at home and update if readings are consistently at or above 140/90. CMP pending.

## 2022-11-08 NOTE — Patient Instructions (Signed)
Stop by the lab prior to leaving today. I will notify you of your results once received.   We reduced your dose of lisinopril to 10 mg daily for blood pressure.  Continue to monitor your blood pressure at home and notify me if your readings become elevated as discussed.  Please attach your upcoming lab results to your MyChart portal.  It was a pleasure to see you today!

## 2022-11-08 NOTE — Assessment & Plan Note (Signed)
He is aware of his hyperlipidemia.  Declines treatment. He declines lipid panel today as he will complete this at [facility]. I have asked that he attach his results to his MyChart portal once results are in.

## 2022-11-16 ENCOUNTER — Other Ambulatory Visit: Payer: Self-pay | Admitting: Primary Care

## 2022-11-16 DIAGNOSIS — I10 Essential (primary) hypertension: Secondary | ICD-10-CM

## 2022-12-29 ENCOUNTER — Other Ambulatory Visit (HOSPITAL_BASED_OUTPATIENT_CLINIC_OR_DEPARTMENT_OTHER): Payer: Self-pay | Admitting: Family Medicine

## 2022-12-29 DIAGNOSIS — E785 Hyperlipidemia, unspecified: Secondary | ICD-10-CM

## 2023-02-24 ENCOUNTER — Ambulatory Visit
Admission: RE | Admit: 2023-02-24 | Discharge: 2023-02-24 | Disposition: A | Discharge status: Home / Self Care | Payer: BC Managed Care – PPO | Source: Ambulatory Visit | Attending: Physician Assistant | Admitting: Physician Assistant

## 2023-02-24 VITALS — BP 121/77 | HR 70 | Temp 98.1°F | Resp 20 | Ht 73.0 in | Wt 202.0 lb

## 2023-02-24 DIAGNOSIS — J0120 Acute ethmoidal sinusitis, unspecified: Secondary | ICD-10-CM

## 2023-02-24 DIAGNOSIS — R591 Generalized enlarged lymph nodes: Secondary | ICD-10-CM | POA: Diagnosis not present

## 2023-02-24 LAB — POCT RAPID STREP A (OFFICE): Rapid Strep A Screen: NEGATIVE

## 2023-02-24 MED ORDER — AMOXICILLIN 500 MG PO CAPS: 500.0000 mg | ORAL_CAPSULE | Freq: Three times a day (TID) | ORAL | 0 refills | Status: AC

## 2023-02-24 NOTE — ED Triage Notes (Addendum)
 Patient presents with nasal drip, chest congestion and mucus x day 7. Treated with Tumeric, throat lozenge, Nyquil, oregano and black seed oil. Patient requested strep test.

## 2023-02-24 NOTE — Discharge Instructions (Addendum)
 See the ENT as scheduled next week

## 2023-02-26 NOTE — ED Provider Notes (Signed)
 [facility]    CSN: [id number] Arrival date & time: 02/24/23  1348      History   Chief Complaint Chief Complaint  Patient presents with   Sore Throat    Scratchy, sore throat nasal drip no fever for about six days. Mainly want to make sure I do not have strep throat. - Entered by patient    HPI Willie Good is a 63 y.o. male.   Patient complains of swelling on both sides of his neck.  Patient thinks that he has some swollen lymph nodes.  Patient reports he is scheduled to see ENT but he is concerned that the areas are increasing in size.  Patient requested a strep test.   Sore Throat    Past Medical History:  Diagnosis Date   Allergy    Essential hypertension    Sepsis ([facility]) 06/22/2017    Patient Active Problem List   Diagnosis Date Noted   Visual changes 07/18/2020   Decreased renal function 04/28/2018   Fatigue 04/28/2018   Preventative health care 04/22/2017   Essential hypertension 04/05/2015   Hyperlipidemia 05/10/2014    Past Surgical History:  Procedure Laterality Date   APPENDECTOMY         Home Medications    Prior to Admission medications   Medication Sig Start Date End Date Taking? Authorizing Provider  amoxicillin  (AMOXIL ) 500 MG capsule Take 1 capsule (500 mg total) by mouth 3 (three) times daily. 02/24/23  Yes Pauline Pegues K, PA-C  acetaminophen  (TYLENOL ) 325 MG tablet Take 2 tablets (650 mg total) by mouth every 6 (six) hours as needed. Patient not taking: Reported on 11/08/2022 06/21/17   Viviann Pastor, MD  lisinopril  (ZESTRIL ) 10 MG tablet Take 1 tablet (10 mg total) by mouth daily. for blood pressure. 11/08/22   Clark, Katherine K, NP  Multiple Vitamins-Minerals (MULTIVITAMIN WITH MINERALS) tablet Take 1 tablet by mouth daily. Patient not taking: Reported on 11/08/2022    [provider]  NON FORMULARY [facility]  ANTIFUNGAL (NAIL)- #1 Patient not taking: Reported on 09/25/2021    [provider]   Omega-3 Fatty Acids (FISH OIL PO) Take 2,000 mg by mouth daily.     [provider]    Family History Family History  Problem Relation Age of Onset   Breast cancer Mother    Diabetes Father    Heart disease Father    Hyperlipidemia Father    Hypertension Father    Diabetes Sister    Cancer Maternal Grandmother    Cancer Maternal Grandfather    Cancer Paternal Grandmother    Diabetes Paternal Grandfather    Heart disease Paternal Grandfather    Hyperlipidemia Paternal Grandfather    Hypertension Paternal Grandfather    Stroke Paternal Grandfather     Social History Social History   Tobacco Use   Smoking status: Never   Smokeless tobacco: Never  Vaping Use   Vaping status: Never Used  Substance Use Topics   Alcohol use: Yes    Alcohol/week: 1.0 standard drink of alcohol    Types: 1 Cans of beer per week   Drug use: No     Allergies   Patient has no known allergies.   Review of Systems Review of Systems  Constitutional:  Negative for fever.  All other systems reviewed and are negative.    Physical Exam Triage Vital Signs ED Triage Vitals  Encounter Vitals Group     BP 02/24/23 1436 121/77  Systolic BP Percentile --      Diastolic BP Percentile --      Pulse Rate 02/24/23 1436 70     Resp 02/24/23 1436 20     Temp 02/24/23 1436 98.1 F (36.7 C)     Temp Source 02/24/23 1436 Oral     SpO2 02/24/23 1436 100 %     Weight 02/24/23 1434 202 lb (91.6 kg)     Height 02/24/23 1434 6' 1 (1.854 m)     Head Circumference --      Peak Flow --      Pain Score 02/24/23 1434 2     Pain Loc --      Pain Education --      Exclude from Growth Chart --    No data found.  Updated Vital Signs BP 121/77 (BP Location: Left Arm)   Pulse 70   Temp 98.1 F (36.7 C) (Oral)   Resp 20   Ht 6' 1 (1.854 m)   Wt 91.6 kg   SpO2 100%   BMI 26.65 kg/m   Visual Acuity Right Eye Distance:   Left Eye Distance:   Bilateral Distance:    Right Eye Near:    Left Eye Near:    Bilateral Near:     Physical Exam Vitals and nursing note reviewed.  Constitutional:      Appearance: He is well-developed.  HENT:     Head: Normocephalic.     Right Ear: Tympanic membrane normal.     Left Ear: Tympanic membrane normal.  Neck:     Comments: Small less than pea-sized bilateral lymphadenopathy. Cardiovascular:     Rate and Rhythm: Normal rate.  Pulmonary:     Effort: Pulmonary effort is normal.  Abdominal:     General: There is no distension.  Musculoskeletal:        General: Normal range of motion.     Cervical back: Normal range of motion and neck supple.  Lymphadenopathy:     Cervical: Cervical adenopathy present.  Neurological:     Mental Status: He is alert and oriented to person, place, and time.      UC Treatments / Results  Labs (all labs ordered are listed, but only abnormal results are displayed) Labs Reviewed  POCT RAPID STREP A (OFFICE)    EKG   Radiology No results found.  Procedures Procedures (including critical care time)  Medications Ordered in UC Medications - No data to display  Initial Impression / Assessment and Plan / UC Course  I have reviewed the triage vital signs and the nursing notes.  Pertinent labs & imaging results that were available during my care of the patient were reviewed by me and considered in my medical decision making (see chart for details).     Strep screen is negative.  As patient has had lymphadenopathy for an extended period of time I will start him on amoxicillin .  Patient is advised to keep follow-up with ENT. Final Clinical Impressions(s) / UC Diagnoses   Final diagnoses:  Lymphadenopathy  Acute ethmoidal sinusitis, recurrence not specified     Discharge Instructions      See the ENT as scheduled next week.     ED Prescriptions     Medication Sig Dispense Auth. Provider   amoxicillin  (AMOXIL ) 500 MG capsule Take 1 capsule (500 mg total) by mouth 3 (three) times  daily. 30 capsule Shiri Hodapp K, PA-C      PDMP not reviewed this encounter. An  After Visit Summary was printed and given to the patient.       Flint Sonny POUR, PA-C 02/26/23 [phone]

## 2023-03-03 ENCOUNTER — Other Ambulatory Visit: Payer: Self-pay | Admitting: Unknown Physician Specialty

## 2023-03-03 DIAGNOSIS — R221 Localized swelling, mass and lump, neck: Secondary | ICD-10-CM

## 2023-03-19 ENCOUNTER — Ambulatory Visit
Admission: RE | Admit: 2023-03-19 | Discharge: 2023-03-19 | Disposition: A | Discharge status: Home / Self Care | Payer: BC Managed Care – PPO | Source: Ambulatory Visit | Attending: Unknown Physician Specialty | Admitting: Unknown Physician Specialty

## 2023-03-19 DIAGNOSIS — R221 Localized swelling, mass and lump, neck: Secondary | ICD-10-CM
